# Patient Record
Sex: Female | Born: 1985 | Race: White | Hispanic: No | Marital: Married | Smoking: Never smoker
Health system: Southern US, Community
[De-identification: ages and names within clinical notes are randomized; demographics above are authoritative.]

## PROBLEM LIST (undated history)

## (undated) ENCOUNTER — Inpatient Hospital Stay (HOSPITAL_COMMUNITY): Payer: Self-pay

## (undated) DIAGNOSIS — F411 Generalized anxiety disorder: Secondary | ICD-10-CM

## (undated) DIAGNOSIS — E785 Hyperlipidemia, unspecified: Secondary | ICD-10-CM

## (undated) DIAGNOSIS — N809 Endometriosis, unspecified: Secondary | ICD-10-CM

## (undated) DIAGNOSIS — E039 Hypothyroidism, unspecified: Secondary | ICD-10-CM

## (undated) DIAGNOSIS — Z87442 Personal history of urinary calculi: Secondary | ICD-10-CM

## (undated) DIAGNOSIS — K5792 Diverticulitis of intestine, part unspecified, without perforation or abscess without bleeding: Secondary | ICD-10-CM

## (undated) DIAGNOSIS — O139 Gestational [pregnancy-induced] hypertension without significant proteinuria, unspecified trimester: Secondary | ICD-10-CM

## (undated) DIAGNOSIS — N946 Dysmenorrhea, unspecified: Secondary | ICD-10-CM

## (undated) DIAGNOSIS — F909 Attention-deficit hyperactivity disorder, unspecified type: Secondary | ICD-10-CM

## (undated) DIAGNOSIS — Z8249 Family history of ischemic heart disease and other diseases of the circulatory system: Secondary | ICD-10-CM

## (undated) DIAGNOSIS — N201 Calculus of ureter: Secondary | ICD-10-CM

## (undated) DIAGNOSIS — I456 Pre-excitation syndrome: Secondary | ICD-10-CM

## (undated) DIAGNOSIS — F419 Anxiety disorder, unspecified: Secondary | ICD-10-CM

## (undated) DIAGNOSIS — G8929 Other chronic pain: Secondary | ICD-10-CM

## (undated) DIAGNOSIS — Z8759 Personal history of other complications of pregnancy, childbirth and the puerperium: Secondary | ICD-10-CM

## (undated) DIAGNOSIS — Z973 Presence of spectacles and contact lenses: Secondary | ICD-10-CM

## (undated) DIAGNOSIS — Z8619 Personal history of other infectious and parasitic diseases: Secondary | ICD-10-CM

## (undated) DIAGNOSIS — R61 Generalized hyperhidrosis: Secondary | ICD-10-CM

## (undated) DIAGNOSIS — Z8679 Personal history of other diseases of the circulatory system: Secondary | ICD-10-CM

## (undated) DIAGNOSIS — D229 Melanocytic nevi, unspecified: Secondary | ICD-10-CM

## (undated) HISTORY — PX: MYRINGOTOMY: SUR874

## (undated) HISTORY — DX: Melanocytic nevi, unspecified: D22.9

## (undated) HISTORY — DX: Gestational (pregnancy-induced) hypertension without significant proteinuria, unspecified trimester: O13.9

## (undated) HISTORY — DX: Personal history of urinary calculi: Z87.442

## (undated) HISTORY — PX: MOLE REMOVAL: SHX2046

## (undated) HISTORY — DX: Personal history of other infectious and parasitic diseases: Z86.19

## (undated) HISTORY — PX: TONSILLECTOMY AND ADENOIDECTOMY: SUR1326

## (undated) HISTORY — DX: Diverticulitis of intestine, part unspecified, without perforation or abscess without bleeding: K57.92

## (undated) HISTORY — PX: TYMPANOSTOMY TUBE PLACEMENT: SHX32

## (undated) HISTORY — DX: Hypothyroidism, unspecified: E03.9

## (undated) HISTORY — PX: CYSTOSCOPY: SUR368

## (undated) HISTORY — DX: Hyperlipidemia, unspecified: E78.5

## (undated) HISTORY — PX: WISDOM TOOTH EXTRACTION: SHX21

## (undated) HISTORY — DX: Pre-excitation syndrome: I45.6

## (undated) HISTORY — PX: COSMETIC SURGERY: SHX468

## (undated) HISTORY — DX: Generalized hyperhidrosis: R61

## (undated) HISTORY — DX: Attention-deficit hyperactivity disorder, unspecified type: F90.9

## (undated) HISTORY — DX: Calculus of ureter: N20.1

## (undated) HISTORY — DX: Dysmenorrhea, unspecified: N94.6

---
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
## 2006-02-19 ENCOUNTER — Ambulatory Visit: Payer: Self-pay | Admitting: Family Medicine

## 2006-05-21 ENCOUNTER — Ambulatory Visit: Payer: Self-pay | Admitting: Family Medicine

## 2006-09-06 ENCOUNTER — Encounter: Payer: Self-pay | Admitting: Family Medicine

## 2006-09-06 LAB — CONVERTED CEMR LAB: Pap Smear: NORMAL

## 2006-09-12 ENCOUNTER — Ambulatory Visit: Payer: Self-pay | Admitting: Family Medicine

## 2006-09-12 ENCOUNTER — Other Ambulatory Visit: Admission: RE | Admit: 2006-09-12 | Discharge: 2006-09-12 | Payer: Self-pay | Admitting: Family Medicine

## 2006-10-02 ENCOUNTER — Ambulatory Visit: Payer: Self-pay | Admitting: Family Medicine

## 2006-12-02 ENCOUNTER — Ambulatory Visit: Payer: Self-pay | Admitting: Family Medicine

## 2006-12-25 ENCOUNTER — Encounter: Payer: Self-pay | Admitting: Family Medicine

## 2006-12-25 DIAGNOSIS — E039 Hypothyroidism, unspecified: Secondary | ICD-10-CM

## 2006-12-25 DIAGNOSIS — E785 Hyperlipidemia, unspecified: Secondary | ICD-10-CM | POA: Insufficient documentation

## 2007-04-07 ENCOUNTER — Ambulatory Visit: Payer: Self-pay | Admitting: Family Medicine

## 2007-08-18 ENCOUNTER — Telehealth: Payer: Self-pay | Admitting: Family Medicine

## 2007-10-19 ENCOUNTER — Other Ambulatory Visit: Admission: RE | Admit: 2007-10-19 | Discharge: 2007-10-19 | Payer: Self-pay | Admitting: Family Medicine

## 2007-10-19 ENCOUNTER — Ambulatory Visit: Payer: Self-pay | Admitting: Family Medicine

## 2007-10-19 ENCOUNTER — Encounter: Payer: Self-pay | Admitting: Family Medicine

## 2007-10-19 DIAGNOSIS — N946 Dysmenorrhea, unspecified: Secondary | ICD-10-CM | POA: Insufficient documentation

## 2007-10-23 ENCOUNTER — Encounter (INDEPENDENT_AMBULATORY_CARE_PROVIDER_SITE_OTHER): Payer: Self-pay | Admitting: *Deleted

## 2007-10-23 LAB — CONVERTED CEMR LAB
AST: 18 units/L (ref 0–37)
Albumin: 4 g/dL (ref 3.5–5.2)
Alkaline Phosphatase: 43 units/L (ref 39–117)
BUN: 10 mg/dL (ref 6–23)
Basophils Absolute: 0 10*3/uL (ref 0.0–0.1)
Chloride: 103 meq/L (ref 96–112)
Cholesterol: 184 mg/dL (ref 0–200)
Eosinophils Absolute: 0.1 10*3/uL (ref 0.0–0.6)
GFR calc non Af Amer: 85 mL/min
HCT: 41.8 % (ref 36.0–46.0)
HDL: 63 mg/dL (ref >39.0)
LDL Cholesterol: 96 mg/dL (ref 0–99)
MCHC: 34.9 g/dL (ref 30.0–36.0)
MCV: 90.5 fL (ref 78.0–100.0)
Monocytes Relative: 6.3 % (ref 3.0–11.0)
Neutrophils Relative %: 64.1 % (ref 43.0–77.0)
Pap Smear: NORMAL
Platelets: 211 10*3/uL (ref 150–400)
RBC: 4.62 M/uL (ref 3.87–5.11)
Sodium: 139 meq/L (ref 135–145)
TSH: 1.04 microintl units/mL (ref 0.35–5.50)
Total CHOL/HDL Ratio: 2.9
Triglycerides: 125 mg/dL (ref 0–149)

## 2007-12-06 DIAGNOSIS — N201 Calculus of ureter: Secondary | ICD-10-CM

## 2007-12-06 HISTORY — DX: Calculus of ureter: N20.1

## 2007-12-10 ENCOUNTER — Telehealth: Payer: Self-pay | Admitting: Family Medicine

## 2007-12-10 ENCOUNTER — Inpatient Hospital Stay (HOSPITAL_COMMUNITY): Admission: AD | Admit: 2007-12-10 | Discharge: 2007-12-10 | Payer: Self-pay | Admitting: Obstetrics & Gynecology

## 2007-12-11 ENCOUNTER — Emergency Department (HOSPITAL_COMMUNITY): Admission: EM | Admit: 2007-12-11 | Discharge: 2007-12-11 | Payer: Self-pay | Admitting: Emergency Medicine

## 2007-12-16 ENCOUNTER — Encounter: Payer: Self-pay | Admitting: Family Medicine

## 2008-04-04 ENCOUNTER — Telehealth: Payer: Self-pay | Admitting: Family Medicine

## 2008-05-03 ENCOUNTER — Encounter: Payer: Self-pay | Admitting: Family Medicine

## 2008-05-09 ENCOUNTER — Telehealth (INDEPENDENT_AMBULATORY_CARE_PROVIDER_SITE_OTHER): Payer: Self-pay | Admitting: *Deleted

## 2008-05-11 ENCOUNTER — Ambulatory Visit: Payer: Self-pay | Admitting: Family Medicine

## 2008-05-11 LAB — CONVERTED CEMR LAB
Bilirubin Urine: NEGATIVE
HCT: 44.1 % (ref 36.0–46.0)
Hemoglobin: 15.3 g/dL — ABNORMAL HIGH (ref 12.0–15.0)
Ketones, urine, test strip: NEGATIVE
RBC / HPF: 0
Specific Gravity, Urine: >=1.03
Urobilinogen, UA: 0.2
WBC, UA: 1 cells/hpf

## 2008-05-13 ENCOUNTER — Encounter (INDEPENDENT_AMBULATORY_CARE_PROVIDER_SITE_OTHER): Payer: Self-pay | Admitting: *Deleted

## 2008-07-07 ENCOUNTER — Telehealth: Payer: Self-pay | Admitting: Family Medicine

## 2008-08-11 ENCOUNTER — Telehealth: Payer: Self-pay | Admitting: Family Medicine

## 2008-08-12 ENCOUNTER — Ambulatory Visit: Payer: Self-pay | Admitting: Family Medicine

## 2008-08-16 ENCOUNTER — Telehealth: Payer: Self-pay | Admitting: Family Medicine

## 2008-12-16 ENCOUNTER — Ambulatory Visit: Payer: Self-pay | Admitting: Family Medicine

## 2008-12-16 DIAGNOSIS — Z87442 Personal history of urinary calculi: Secondary | ICD-10-CM | POA: Insufficient documentation

## 2008-12-16 HISTORY — DX: Personal history of urinary calculi: Z87.442

## 2008-12-16 LAB — CONVERTED CEMR LAB
Beta hcg, urine, semiquantitative: NEGATIVE
Bilirubin Urine: NEGATIVE
Casts: 0 /lpf
Glucose, Urine, Semiquant: NEGATIVE
Specific Gravity, Urine: 1.015
Yeast, UA: 0
pH: 6.5

## 2008-12-19 LAB — CONVERTED CEMR LAB
CO2: 28 meq/L (ref 19–32)
Calcium: 9.7 mg/dL (ref 8.4–10.5)
FSH: 10.2 milliintl units/mL
Free T4: 1.1 ng/dL (ref 0.6–1.6)
Glucose, Bld: 84 mg/dL (ref 70–99)
Hemoglobin: 15.6 g/dL — ABNORMAL HIGH (ref 12.0–15.0)
LH: 88.1 milliintl units/mL
Lymphocytes Relative: 32.4 % (ref 12.0–46.0)
Monocytes Relative: 9 % (ref 3.0–12.0)
Neutro Abs: 3 10*3/uL (ref 1.4–7.7)
RBC: 4.77 M/uL (ref 3.87–5.11)
RDW: 11.8 % (ref 11.5–14.6)
Sodium: 141 meq/L (ref 135–145)
TSH: 0.79 microintl units/mL (ref 0.35–5.50)

## 2009-12-11 ENCOUNTER — Ambulatory Visit: Payer: Self-pay | Admitting: Family Medicine

## 2009-12-11 LAB — CONVERTED CEMR LAB: Rapid Strep: NEGATIVE

## 2009-12-12 ENCOUNTER — Emergency Department (HOSPITAL_COMMUNITY)
Admission: EM | Admit: 2009-12-12 | Discharge: 2009-12-12 | Payer: Self-pay | Source: Home / Self Care | Admitting: Emergency Medicine

## 2009-12-29 ENCOUNTER — Telehealth: Payer: Self-pay | Admitting: Family Medicine

## 2009-12-29 ENCOUNTER — Ambulatory Visit: Payer: Self-pay | Admitting: Family Medicine

## 2009-12-29 LAB — CONVERTED CEMR LAB
Casts: 0 /lpf
Ketones, urine, test strip: NEGATIVE
Nitrite: NEGATIVE
Specific Gravity, Urine: 1.01
Urine crystals, microscopic: 0 /hpf
WBC Urine, dipstick: NEGATIVE
Yeast, UA: 0

## 2009-12-30 ENCOUNTER — Encounter: Payer: Self-pay | Admitting: Family Medicine

## 2010-01-01 ENCOUNTER — Telehealth: Payer: Self-pay | Admitting: Family Medicine

## 2010-01-01 ENCOUNTER — Encounter: Payer: Self-pay | Admitting: Family Medicine

## 2010-01-25 ENCOUNTER — Encounter: Payer: Self-pay | Admitting: Family Medicine

## 2010-01-25 ENCOUNTER — Ambulatory Visit: Payer: Self-pay | Admitting: Family Medicine

## 2010-01-25 LAB — CONVERTED CEMR LAB
Bilirubin Urine: NEGATIVE
Glucose, Urine, Semiquant: NEGATIVE
WBC Urine, dipstick: NEGATIVE
pH: 5

## 2010-03-13 ENCOUNTER — Telehealth (INDEPENDENT_AMBULATORY_CARE_PROVIDER_SITE_OTHER): Payer: Self-pay | Admitting: *Deleted

## 2010-03-14 ENCOUNTER — Ambulatory Visit: Payer: Self-pay | Admitting: Family Medicine

## 2010-03-14 LAB — CONVERTED CEMR LAB
ALT: 30 units/L (ref 0–35)
AST: 33 units/L (ref 0–37)
Albumin: 4.4 g/dL (ref 3.5–5.2)
BUN: 9 mg/dL (ref 6–23)
Chloride: 107 meq/L (ref 96–112)
Cholesterol: 177 mg/dL (ref 0–200)
Eosinophils Relative: 1.3 % (ref 0.0–5.0)
Free T4: 1.2 ng/dL (ref 0.6–1.6)
Glucose, Bld: 82 mg/dL (ref 70–99)
HCT: 43.1 % (ref 36.0–46.0)
Hemoglobin: 14.8 g/dL (ref 12.0–15.0)
LDL Cholesterol: 87 mg/dL (ref 0–99)
Lymphs Abs: 2.5 10*3/uL (ref 0.7–4.0)
MCV: 95.1 fL (ref 78.0–100.0)
Monocytes Absolute: 0.6 10*3/uL (ref 0.1–1.0)
Monocytes Relative: 8.7 % (ref 3.0–12.0)
Neutro Abs: 3.2 10*3/uL (ref 1.4–7.7)
Platelets: 193 10*3/uL (ref 150.0–400.0)
Potassium: 4.2 meq/L (ref 3.5–5.1)
Sodium: 141 meq/L (ref 135–145)
TSH: 1.32 microintl units/mL (ref 0.35–5.50)
Total Bilirubin: 0.8 mg/dL (ref 0.3–1.2)
Total Protein: 6.9 g/dL (ref 6.0–8.3)
WBC: 6.5 10*3/uL (ref 4.5–10.5)

## 2010-03-21 ENCOUNTER — Other Ambulatory Visit: Admission: RE | Admit: 2010-03-21 | Discharge: 2010-03-21 | Payer: Self-pay | Admitting: Family Medicine

## 2010-03-21 ENCOUNTER — Ambulatory Visit: Payer: Self-pay | Admitting: Family Medicine

## 2010-03-21 LAB — CONVERTED CEMR LAB
Ketones, urine, test strip: NEGATIVE
Nitrite: NEGATIVE
Specific Gravity, Urine: 1.015
Urobilinogen, UA: 0.2
WBC Urine, dipstick: NEGATIVE

## 2010-03-21 LAB — HM PAP SMEAR

## 2010-03-23 ENCOUNTER — Encounter (INDEPENDENT_AMBULATORY_CARE_PROVIDER_SITE_OTHER): Payer: Self-pay | Admitting: *Deleted

## 2010-03-23 LAB — CONVERTED CEMR LAB: Pap Smear: NEGATIVE

## 2010-03-28 ENCOUNTER — Encounter: Payer: Self-pay | Admitting: Family Medicine

## 2010-06-28 ENCOUNTER — Ambulatory Visit: Payer: Self-pay | Admitting: Internal Medicine

## 2010-06-28 DIAGNOSIS — R109 Unspecified abdominal pain: Secondary | ICD-10-CM | POA: Insufficient documentation

## 2010-06-28 LAB — CONVERTED CEMR LAB
Beta hcg, urine, semiquantitative: NEGATIVE
Bilirubin Urine: NEGATIVE
Ketones, urine, test strip: NEGATIVE
Mucus, UA: 0
Nitrite: NEGATIVE
Urobilinogen, UA: 0.2

## 2010-06-29 ENCOUNTER — Encounter: Payer: Self-pay | Admitting: Family Medicine

## 2010-07-05 ENCOUNTER — Encounter: Payer: Self-pay | Admitting: Family Medicine

## 2010-07-10 ENCOUNTER — Ambulatory Visit
Admission: RE | Admit: 2010-07-10 | Discharge: 2010-07-10 | Payer: Self-pay | Source: Home / Self Care | Admitting: Urology

## 2010-07-10 HISTORY — PX: CYSTOSCOPY/RETROGRADE/URETEROSCOPY: SHX5316

## 2010-08-08 ENCOUNTER — Telehealth: Payer: Self-pay | Admitting: Family Medicine

## 2010-11-06 NOTE — Consult Note (Signed)
Summary: Dr.Houston Kimbrough,[facility],Note  Dr.Houston Kimbrough,[facility],Note   Imported By: Beau Fanny 07/06/2010 09:19:24  _____________________________________________________________________  External Attachment:    Type:   Image     Comment:   External Document

## 2010-11-06 NOTE — Progress Notes (Signed)
Summary: ? Kidney stone  Phone Note Call from Patient   Caller: Patient Call For: Judith Part MD Summary of Call: Patient states that she may have passed a kidney stone or has a kidney stone.  Pain upon urination, blood in urine, pressure in lower abdominal area.  No back pain.  Symptoms started yesterday.  Please advise. Initial call taken by: Linde Gillis CMA Duncan Dull),  December 29, 2009 2:55 PM  Follow-up for Phone Call        squeeze her in sometime this afternoon please for a quick visit - thanks  Follow-up by: Judith Part MD,  December 29, 2009 3:00 PM  Additional Follow-up for Phone Call Additional follow up Details #1::        Put here on the schedule for 3:30 this afternoon. Additional Follow-up by: Linde Gillis CMA Duncan Dull),  December 29, 2009 3:14 PM

## 2010-11-06 NOTE — Assessment & Plan Note (Signed)
Summary: YEARLY PHYSICAL/JRR   Vital Signs:  Patient profile:   25 year old female Menstrual status:  regular Height:      64.5 inches Weight:      129.50 pounds BMI:     21.96 Temp:     97.6 degrees F oral Pulse rate:   72 / minute Pulse rhythm:   regular BP sitting:   106 / 64  (left arm) Cuff size:   regular  Vitals Entered By: Lewanda Rife LPN (March 21, 2010 10:37 AM) CC: cpx and ? kidney stone    History of Present Illness: here for wellness exam   wt is down 7 lb with bmi 21  bp great 106/64  hypothyroidism  tsh and free T4 are stable   menses - have been tolerable  pap 1/09- due for one  will go on honeymoon in october  is sexually active and using condoms  is ok with doing gon and chlam screen with  pap  with her fiancee 7 years     Td 07  has been having trouble with R sided kidney stones - [facility] still blood in urine today  thinks stone in ureter -- still there improved and then got worse again -- pain started up last night  needs to f/u with urology-- may need surgical intervention  is drinking lots of fluids  wants to have x ray for that - KUB -- wants to [facility]   no chance pregnant    lipids are great with trig 102 HDL 69 and LDL 87- excellent    Allergies: 1)  ! 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 Eyes:  Denies blurring and eye irritation. CV:  Denies chest pain or discomfort, lightheadness, palpitations, and shortness of breath with exertion. Resp:  Denies cough, shortness of breath, and wheezing. GI:  Complains of abdominal pain and nausea; denies bloody stools, change in bowel habits, indigestion, and vomiting. GU:  Complains of hematuria; denies dysuria and urinary frequency. MS:  Complains of low back pain; denies cramps, muscle weakness, and stiffness. Derm:  Denies itching, lesion(s), poor wound healing, and rash. Neuro:  Denies numbness and tingling. Psych:  Denies anxiety and depression. Endo:  Denies cold intolerance, excessive thirst, excessive urination, and heat intolerance. Heme:  Denies abnormal bruising and enlarge lymph nodes.  Physical Exam  General:  Well-developed,well-nourished,in no acute distress; alert,appropriate and cooperative throughout examination Head:  normocephalic, atraumatic, and no abnormalities observed.   Eyes:  vision grossly intact, pupils equal, pupils round, and pupils reactive to light.  no conjunctival pallor, injection or icterus  Ears:  R ear normal and L ear normal.   Nose:  no nasal discharge.   Mouth:  pharynx pink and moist.   Neck:  supple with full rom and no masses or thyromegally, no JVD or carotid bruit  Chest Wall:  No deformities, masses,  or tenderness noted. Breasts:  No mass, nodules, thickening, tenderness, bulging, retraction, inflamation, nipple discharge or skin changes noted.   Lungs:  Normal respiratory effort, chest expands symmetrically. Lungs are clear to auscultation, no crackles or wheezes. Heart:  Normal rate and regular rhythm. S1 and S2 normal without gallop, murmur, click, rub or other extra sounds. Abdomen:  tender R llower abd and flank without rebound or gaurding  soft, normal bowel sounds, no  distention, no masses, no hepatomegaly, and no splenomegaly.   Genitalia:  Normal introitus for age, no external lesions, no vaginal discharge, mucosa pink and moist, no vaginal or cervical lesions, no vaginal atrophy, no friaility or hemorrhage, normal uterus size and position, no adnexal masses or tenderness Msk:  some mild R cva tenderness no acute joint changes  Pulses:  R and L carotid,radial,femoral,dorsalis pedis and posterior tibial pulses are full and equal bilaterally Extremities:  No clubbing, cyanosis, edema, or deformity noted with normal full range of motion of all joints.   Neurologic:  sensation intact to light touch, gait normal, and DTRs symmetrical and normal.   Skin:  Intact without suspicious lesions or rashes Cervical Nodes:  No lymphadenopathy noted Axillary Nodes:  No palpable lymphadenopathy Inguinal Nodes:  No significant adenopathy Psych:  normal affect, talkative and pleasant    Impression & Recommendations:  Problem # 1:  HEALTH MAINTENANCE EXAM (ICD-V70.0) Assessment Comment Only reviewed health habits including diet, exercise and skin cancer prevention reviewed health maintenance list and family history  adv to stop tanner - and get derm check once yearly for skin cancer disc imp of sun protection for skin cancer prevention   Problem # 2:  ROUTINE GYNECOLOGICAL EXAMINATION (ICD-V72.31) Assessment: Comment Only annual exam std test at pt request  start depo provera for contraception and period control as well  Problem # 3:  RENAL CALCULUS, HX OF (ICD-V13.01) Assessment: Deteriorated with continued hematuria and recurrence of flank pain urol consult asap  KUB today to see if prev ureteral R stone is stil the problem Orders: Radiology Referral (Radiology) Urology Referral (Urology) UA Dipstick w/o Micro (manual) (16109)  Problem # 4:  HYPOTHYROIDISM (ICD-244.9) Assessment: Unchanged  good labs and stable clinically  Her updated medication list  for this problem includes:    Synthroid 50 Mcg Tabs (Levothyroxine sodium) .Marland Kitchen... 1 by mouth once daily  Labs Reviewed: TSH: 1.32 (03/14/2010)    Chol: 177 (03/14/2010)   HDL: 69.20 (03/14/2010)   LDL: 87 (03/14/2010)   TG: 102.0 (03/14/2010)  Orders: Prescription Created Electronically [phone])  Problem # 5:  HYPERLIPIDEMIA (ICD-272.4) Assessment: Improved  great chol with good diet commended on this  rev labs with pt   Labs Reviewed: SGOT: 33 (03/14/2010)   SGPT: 30 (03/14/2010)   HDL:69.20 (03/14/2010), 63.0 (10/19/2007)  LDL:87 (03/14/2010), 96 (10/19/2007)  Chol:177 (03/14/2010), 184 (10/19/2007)  Trig:102.0 (03/14/2010), 125 (10/19/2007)  Problem # 6:  SEXUALLY TRANSMITTED DISEASE, EXPOSURE TO (ICD-V01.6) Assessment: Comment Only will add std screen to pap at pt's request uses condoms / one partner- not high risk   Problem # 7:  CONTRACEPTIVE MANAGEMENT (ICD-V25.09) Assessment: New  starting depo provera  today- with wedding upcoming  disc poss side eff incl wt gain/ irregular bleeding - and usually amenorrhea after 2-3 mo (depending on pt)  will return in 3 mo for next injection  Orders: Depo-Provera 150mg  (J1055) Admin of Therapeutic Inj  intramuscular or subcutaneous (09811)  Complete Medication List: 1)  Synthroid 50 Mcg Tabs (Levothyroxine sodium) .Marland Kitchen.. 1 by  mouth once daily 2)  Roxicet 5-325 Mg Tabs (Oxycodone-acetaminophen) .... Take one to two  tablets by mouth every four to six hours as needed for pain 3)  Hydrocodone-acetaminophen 5-500 Mg Tabs (Hydrocodone-acetaminophen) .... Take one tablet by mouth every six hours as needed for pain  Patient Instructions: 1)  first depo provera shot today  2)  we will update you with pap and other test results  3)  we will do urology referral at check  out  4)  we will do radiology referral at check out  Prescriptions: SYNTHROID 50 MCG  TABS (LEVOTHYROXINE SODIUM) 1 by mouth once daily  #30 x 11   Entered and  Authorized by:   Judith Part MD   Signed by:   Judith Part MD on 03/21/2010   Method used:   [facility]        [facility] [id number]       [address]       [place], [place]  [address]       Ph: [phone] or [phone]       Fax: [phone]   RxID:   [id number]   Current Allergies (reviewed today): ! * Wilfred Curtis  Laboratory Results   Urine Tests  Date/Time Received: March 21, 2010 10:39 AM  Date/Time Reported: March 21, 2010 10:39 AM   Routine Urinalysis   Color: yellow Appearance: Cloudy Glucose: negative   (Normal Range: Negative) Bilirubin: negative   (Normal Range: Negative) Ketone: negative   (Normal Range: Negative) Spec. Gravity: 1.015   (Normal Range: 1.003-1.035) Blood: large   (Normal Range: Negative) pH: 6.0   (Normal Range: 5.0-8.0) Protein: trace   (Normal Range: Negative) Urobilinogen: 0.2   (Normal Range: 0-1) Nitrite: negative   (Normal Range: Negative) Leukocyte Esterace: negative   (Normal Range: Negative)    Urine HCG: negative       Medication Administration  Injection # 1:    Medication: Depo-Provera 150mg     Diagnosis: CONTRACEPTIVE MANAGEMENT (ICD-V25.09)    Route: IM    Site: LUOQ gluteus    Exp Date: 08/07/2012    Lot #: [id number]    Mfr: Francisca December    Patient tolerated injection without complications    Given by: Lewanda Rife LPN (March 21, 2010 11:54 AM)  Orders Added: 1)  Radiology Referral [Radiology] 2)  Urology Referral [Urology] 3)  Depo-Provera 150mg  [J1055] 4)  Admin of Therapeutic Inj  intramuscular or subcutaneous [96372] 5)  UA Dipstick w/o Micro (manual) [81002] 6)  Est. Patient 18-39 years [99395] 7)  Est. Patient Level II [34742] 8)  Prescription Created Electronically [phone]

## 2010-11-06 NOTE — Progress Notes (Signed)
Summary: kidney stone  Phone Note Call from Patient Call back at Home Phone [phone] Call back at [phone]   Caller: Patient Call For: Judith Part MD Summary of Call: Victoria Russell says she was seen at her urologist. She is passing kidney stone now. She wants to know if she needs to continue taking antibiotics. Please advise. Initial call taken by: Melody Comas,  January 01, 2010 10:47 AM  Follow-up for Phone Call        thanks for the update I'm glad she passed it  can stop abx unless her urologist wants her to continue  Follow-up by: Judith Part MD,  January 01, 2010 11:13 AM  Additional Follow-up for Phone Call Additional follow up Details #1::        Left message that patient could stop the antibiotics if okay with urologist.  Additional Follow-up by: Melody Comas,  January 01, 2010 11:36 AM

## 2010-11-06 NOTE — Assessment & Plan Note (Signed)
Summary: URINE SAMPLE FOR POSSIBLE KIDNEY STONE   Nurse Visit   Allergies: 1)  ! * Synthroid Generic Laboratory Results   Urine Tests  Date/Time Received: January 25, 2010 8:02 AM  Date/Time Reported: January 25, 2010 8:02 AM   Routine Urinalysis   Color: orange Appearance: Clear Glucose: negative   (Normal Range: Negative) Bilirubin: negative   (Normal Range: Negative) Ketone: negative   (Normal Range: Negative) Spec. Gravity: 1.015   (Normal Range: 1.003-1.035) Blood: large   (Normal Range: Negative) pH: 5.0   (Normal Range: 5.0-8.0) Protein: negative   (Normal Range: Negative) Urobilinogen: 0.2   (Normal Range: 0-1) Nitrite: negative   (Normal Range: Negative) Leukocyte Esterace: negative   (Normal Range: Negative)       Orders Added: 1)  Urology Referral [Urology] Prescriptions: HYDROCODONE-ACETAMINOPHEN 5-500 MG TABS (HYDROCODONE-ACETAMINOPHEN) take one tablet by mouth every six hours as needed for pain  #60 x 0   Entered and Authorized by:   Ruthe Mannan MD   Signed by:   Ruthe Mannan MD on 01/25/2010   Method used:   Print then Give to Patient   RxID:   [id number]

## 2010-11-06 NOTE — Miscellaneous (Signed)
Summary: Controlled Substance Agreement  Controlled Substance Agreement   Imported By: Lanelle Bal 03/26/2010 07:45:53  _____________________________________________________________________  External Attachment:    Type:   Image     Comment:   External Document

## 2010-11-06 NOTE — Progress Notes (Signed)
----   Converted from flag ---- ---- 03/13/2010 8:33 AM, Colon Flattery Tower MD wrote: please check wellness and lipids as well as free T4 244.9, v70.0- thanks   ---- 03/13/2010 8:12 AM, Liane Comber CMA (AAMA) wrote: Pt is scheduled for cpx labs tomorrow, what labs to draw and dx codes? Thanks Tasha ------------------------------

## 2010-11-06 NOTE — Assessment & Plan Note (Signed)
Summary: POSSIBLE UTI OR KIDNEY STONE????   Vital Signs:  Patient profile:   25 year old female Menstrual status:  regular Temp:     98.1 degrees F oral Pulse rate:   60 / minute Pulse rhythm:   regular BP sitting:   110 / 78  (left arm) Cuff size:   regular  Vitals Entered By: Selena Batten Dance CMA Duncan Dull) (June 28, 2010 8:59 AM) CC: ? UTI/Kidney stone   History of Present Illness: CC: ? kidney stone  25 yo with known nephrolithiasis and known stones in right kidney measuring 2mm followed by urology ([facility]) presents with 1d h/o abd pain, sharp intermittent.  No radiation.  No nausea/vomiting.  Appetite down (no breakfast this am).  No fevers/chills.  No bowel changes (no d/c.)  Still with appendix.  No smoking.  Using condom, no longer on depo.  Allergies: 1)  ! 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 Lungs are clear to auscultation, no crackles or wheezes. Heart:  Normal rate and regular rhythm. S1 and S2 normal without gallop, murmur, click, rub or other extra sounds. Abdomen:  max tenderness suprapubically but tender throughout abd with palpation, NABS, no masses or HSM, no rebound or peritonitis sxs.  no CVA tenderness Pulses:  2+ rad pulses Extremities:  No clubbing, cyanosis, edema, or deformity noted with normal full range of motion of all joints.   Skin:  Intact without suspicious lesions or rashes   Impression & Recommendations:  Problem # 1:  ABDOMINAL PAIN,  SUPRAPUBIC (ICD-789.09)  Sounds like return of kidney stones.  Shot of toradol today, Start flomax as well as naprosyn, roxicet for breakthrough pain.  close f/u with urology.  doubt appendicitis (no rebound, h/o kidney stones, large blood on UA).  Her updated medication list for this problem includes:    Roxicet 5-325 Mg Tabs (Oxycodone-acetaminophen) .Marland Kitchen... Take one to two  tablets by mouth every four to six hours as needed for pain    Hydrocodone-acetaminophen 5-500 Mg Tabs (Hydrocodone-acetaminophen) .Marland Kitchen... Take one tablet by mouth every six hours as needed for pain    Naproxen 500 Mg Tabs (Naproxen) .Marland Kitchen... Take one by mouth two times a day with food.  Orders: Admin of Therapeutic Inj  intramuscular or subcutaneous (16109) Ketorolac-Toradol 15mg  (U0454) Urology Referral (Urology)  Complete Medication List: 1)  Synthroid 50 Mcg Tabs (Levothyroxine sodium) .Marland Kitchen.. 1 by mouth once daily 2)  Roxicet 5-325 Mg Tabs (Oxycodone-acetaminophen) .... Take one to two  tablets by mouth every four to six hours as needed for pain 3)  Hydrocodone-acetaminophen 5-500 Mg Tabs (Hydrocodone-acetaminophen) .... Take one tablet by mouth every six hours as needed for pain 4)  Flomax 0.4 Mg Caps (Tamsulosin hcl) .... One daily x 10 days 5)  Naproxen 500 Mg Tabs (Naproxen) .... Take one by mouth two times a day with food.  Patient Instructions: 1)  Pass by [facility]'s office for appointment with urology. 2)  I think you have a return of your kidney stone.  Start flomax daily as well as naprosyn twice daily with food.  Oxycodone  for breakthrough pain only. 3)  Good to see you today, I hope you feel better. Prescriptions: ROXICET 5-325 MG TABS (OXYCODONE-ACETAMINOPHEN) take one to two  tablets by mouth every four to six hours as needed for pain  #20 x 0   Entered and Authorized by:   Eustaquio Boyden  MD   Signed by:   Eustaquio Boyden  MD on 06/28/2010   Method used:   Print then Give to Patient   RxID:    [id number] NAPROXEN 500 MG TABS (NAPROXEN) take one by mouth two times a day with food.  #30 x 0   Entered and Authorized by:   Eustaquio Boyden  MD   Signed by:   Eustaquio Boyden  MD on 06/28/2010   Method used:   [facility]        [facility] [facility]       [address]       [place], [place]  [address]       Ph: [phone] or [phone]       Fax: [phone]   RxID:   [id number] FLOMAX 0.4 MG CAPS (TAMSULOSIN HCL) one daily x 10 days  #10 x 0   Entered and Authorized by:   Eustaquio Boyden  MD   Signed by:   Eustaquio Boyden  MD on 06/28/2010   Method used:   [facility]        [facility] [id number]       [address]       [place], [place]  [address]       Ph: [phone] or [phone]       Fax: [phone]   RxID:   [id number]   Laboratory Results   Urine Tests  Date/Time Received: June 28, 2010 8:54 AM  Date/Time Reported: June 28, 2010 8:54 AM   Routine Urinalysis   Color: dark yellow Appearance: Cloudy Glucose: negative   (Normal Range: Negative) Bilirubin: negative   (Normal Range: Negative) Ketone: negative   (Normal Range: Negative) Spec. Gravity: 1.020   (Normal Range: 1.003-1.035) Blood: large   (Normal Range: Negative) pH: 6.0   (Normal Range: 5.0-8.0) Protein: trace   (Normal Range: Negative) Urobilinogen: 0.2   (Normal Range: 0-1) Nitrite: negative   (Normal Range: Negative) Leukocyte Esterace: trace   (Normal Range: Negative)  Urine Microscopic WBC/HPF: rare RBC/HPF: TNTC Bacteria/HPF: tr Mucous/HPF: 0 Epithelial/HPF: rare Crystals/HPF: yes Casts/LPF: 0 Yeast/HPF: 0    Urine HCG: negative Comments: read by .............Eustaquio Boyden  MD  June 28, 2010 9:17 AM      Current Allergies (reviewed today): ! * SYNTHROID GENERIC    Medication Administration  Injection # 1:    Medication: Ketorolac-Toradol 15mg     Diagnosis: ABDOMINAL PAIN,  SUPRAPUBIC (ICD-789.09)    Route: IM    Site: R deltoid    Exp Date: 12/06/2011    Lot #: [id number]    Mfr: Hospira    Comments: 30mg  per Dr. Sharen Hones    Patient tolerated injection without complications    Given by: Selena Batten Dance CMA Duncan Dull) (June 28, 2010 9:29 AM)  Orders Added: 1)  Est. Patient Level III [73419] 2)  Admin of Therapeutic Inj  intramuscular or subcutaneous [96372] 3)  Ketorolac-Toradol 15mg  [J1885] 4)  Urology Referral [Urology]

## 2010-11-06 NOTE — Miscellaneous (Signed)
Summary: pap results   Clinical Lists Changes  Observations: Added new observation of PAP SMEAR: normal (03/23/2010 15:18)      Preventive Care Screening  Pap Smear:    Date:  03/23/2010    Results:  normal

## 2010-11-06 NOTE — Assessment & Plan Note (Signed)
Summary: ? Kidney stone/nt   Vital Signs:  Patient profile:   25 year old female Menstrual status:  regular Height:      64.5 inches Weight:      136.13 pounds BMI:     23.09 Temp:     97.6 degrees F oral Pulse rate:   64 / minute Pulse rhythm:   regular BP sitting:   112 / 70  (left arm) Cuff size:   regular  Vitals Entered By: Linde Gillis CMA Duncan Dull) (December 29, 2009 3:25 PM) CC: possible kidney stone   History of Present Illness: this is not as bad as the last one   yesterday mid day - the pain started  some burning to urinate and pressure over bladder no back or flank pain   also saw some blood in urine no n/v no fever  feels not too tired   has to urinate a lot -- gets there and not much volume     Allergies: 1)  ! 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 Eyes:  Denies blurring and eye pain. CV:  Denies chest pain or discomfort and palpitations. Resp:  Denies cough and wheezing. GI:  Complains of abdominal pain; denies bloody stools, change in bowel habits, and indigestion. GU:  Complains of dysuria, hematuria, nocturia, and urinary frequency. MS:  Denies low back pain. Derm:  Denies itching, lesion(s), poor wound healing, and rash. Heme:  Denies abnormal  bruising and bleeding.  Physical Exam  General:  Well-developed,well-nourished,in no acute distress; alert,appropriate and cooperative throughout examination Head:  normocephalic, atraumatic, and no abnormalities observed.   Mouth:  pharynx pink and moist.   Neck:  No deformities, masses, or tenderness noted. Lungs:  Normal respiratory effort, chest expands symmetrically. Lungs are clear to auscultation, no crackles or wheezes. Heart:  Normal rate and regular rhythm. S1 and S2 normal without gallop, murmur, click, rub or other extra sounds. Abdomen:  suprapubic tenderness- without rebound or gaurding  no flank or back pain Msk:  No deformity or scoliosis noted of thoracic or lumbar spine.  no CVA tenderness  Skin:  Intact without suspicious lesions or rashes Cervical Nodes:  No lymphadenopathy noted Inguinal Nodes:  No significant adenopathy Psych:  normal affect, talkative and pleasant    Impression & Recommendations:  Problem # 1:  UTI (ICD-599.0) Assessment New with voiding symptoms and pos ua  no back or flank pain to indicate stone- but does have hematuria and hx of stone  will tx with cipro and update urine sent for culture - to update adv to call if worse or back or flank pain Her updated medication list for this problem includes:    Cipro 250 Mg Tabs (Ciprofloxacin hcl) .Marland Kitchen... 1 by mouth two times a day for 5 days  Orders: T-Culture, Urine ([id number]) Specimen Handling (09811) Prescription Created Electronically [id number])  Her updated medication list for this problem includes:  Cipro 250 Mg Tabs (Ciprofloxacin hcl) .Marland Kitchen... 1 by mouth two times a day for 5 days  Complete Medication List: 1)  Synthroid 50 Mcg Tabs (Levothyroxine sodium) .Marland Kitchen.. 1 by mouth once daily 2)  Roxicet 5-325 Mg Tabs (Oxycodone-acetaminophen) .... Take one to two  tablets by mouth every four to six hours as needed for pain 3)  Hydrocodone-acetaminophen 5-500 Mg Tabs (Hydrocodone-acetaminophen) ....  Take one tablet by mouth every six hours as needed for pain 4)  Cipro 250 Mg Tabs (Ciprofloxacin hcl) .Marland Kitchen.. 1 by mouth two times a day for 5 days  Patient Instructions: 1)  continue drinking lots of water 2)  call or seek care is symptoms don't improve in 2-3 days or if you develop back pain, nausea, or vomiting 3)  please update me or call nurse line if back pain or you think you have a stone  4)  I will update you when culture returns  Prescriptions: CIPRO 250 MG TABS (CIPROFLOXACIN HCL) 1 by mouth two times a day for 5 days  #10 x 0   Entered and Authorized by:   Judith Part MD   Signed by:   Judith Part MD on 12/29/2009   Method used:   [facility] to        [id number]       [address]       [place], [place]  [address]       Ph: [phone] or [phone]       Fax: [phone]   RxID:   [id number]   Current Allergies (reviewed today): ! * [facility]  Laboratory Results   Urine Tests   Date/Time Reported: December 29, 2009 3:52 PM   Routine Urinalysis   Color: yellow Appearance: Clear Glucose: negative   (Normal Range: Negative) Bilirubin: negative   (Normal Range: Negative) Ketone: negative   (Normal Range: Negative) Spec. Gravity: 1.010   (Normal Range: 1.003-1.035) Blood: trace-intact   (Normal Range: Negative) pH: 7.0   (Normal Range: 5.0-8.0) Protein: trace   (Normal Range: Negative) Urobilinogen: 0.2   (Normal Range: 0-1) Nitrite: negative   (Normal Range: Negative) Leukocyte Esterace: negative   (Normal Range: Negative)  Urine Microscopic WBC/HPF: 5-10 RBC/HPF: 5-10 Bacteria/HPF: many  Mucous/HPF: few  Epithelial/HPF: 1-2  Crystals/HPF: 0 Casts/LPF: 0 Yeast/HPF: 0 Other: 0

## 2010-11-06 NOTE — Assessment & Plan Note (Signed)
Summary: SORE THROAT, EAR STOPPED UP   Vital Signs:  Patient profile:   25 year old female Menstrual status:  regular Height:      64.5 inches Weight:      133.50 pounds BMI:     22.64 Temp:     98.2 degrees F oral Pulse rate:   60 / minute Pulse rhythm:   regular BP sitting:   100 / 60  (left arm) Cuff size:   regular  Vitals Entered By: Lewanda Rife LPN (December 11, 1608 12:35 PM)  History of Present Illness: a sore throat and funny feeling in L ear -pain and pressure and ringing  no fever now or before  st is not severe  some nasal drainage and mostly congestion -- green d/c and also coughs up some green d/c  symptoms just started yesterday   no n/v/d mild cough non smoker    strep test is neg   Allergies: 1)  ! 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 Eyes:  Denies blurring, discharge, and eye irritation. ENT:  Complains of earache, hoarseness, nasal congestion, postnasal drainage, sinus pressure, and sore throat; denies ear discharge. CV:  Denies chest pain or discomfort and palpitations. Resp:  Complains of cough and sputum productive; denies pleuritic, shortness of breath, and wheezing. GI:  Denies diarrhea, nausea, and vomiting. Derm:  Denies  rash.  Physical Exam  General:  Well-developed,well-nourished,in no acute distress; alert,appropriate and cooperative throughout examination Head:  normocephalic, atraumatic, and no abnormalities observed.  no sinus or temporal tenderness  Eyes:  vision grossly intact, pupils equal, pupils round, pupils reactive to light, and no injection.   Ears:  R ear normal.  L TM dull with effusion- no erythema or bulging  Nose:  nares are congested and injected  Mouth:  pharynx pink and moist, no erythema, and no exudates.   Neck:  No deformities, masses, or tenderness noted. Lungs:  Normal respiratory effort, chest expands symmetrically. Lungs are clear to auscultation, no crackles or wheezes. Heart:  Normal rate and regular rhythm. S1 and S2 normal without gallop, murmur, click, rub or other extra sounds. Skin:  Intact without suspicious lesions or rashes Cervical Nodes:  No lymphadenopathy noted Psych:  normal affect, talkative and pleasant    Impression & Recommendations:  Problem # 1:  URI (ICD-465.9) Assessment New  viral with st and head and chest congestion - and neg strep test  recommend sympt care- see pt instructions   fluids/rest  pt advised to update me if symptoms worsen or do not improve - esp if any facial pain or fever  given sample of nasonex also to try for congestion  Orders: Rapid Strep (96045)  Complete Medication List: 1)  Synthroid 50 Mcg Tabs (Levothyroxine sodium) .Marland Kitchen.. 1 by mouth once daily  Patient Instructions: 1)  drink lots of fluid  2)  zinc losenges - halls does a halls "  complete" 2-3 per day  3)  try nasonex 2 sprays in each nostril  once daily for 1-2 weeks  4)  try mucinex D in am , and plain mucinex in pm  5)  tylenol for sore throat or fever  6)  keep me updated if worse /sinus pain / fever or not starting to improve in a week   Current Allergies (reviewed today): ! Wilfred Curtis  Laboratory Results  Date/Time Received: December 11, 2009  12:36 PM  Date/Time Reported: December 11, 2009 12:36 PM   Other Tests  Rapid Strep: negative  Kit Test Internal QC: Positive   (Normal Range: Negative)

## 2010-11-06 NOTE — Progress Notes (Signed)
Summary: diarrhea  Phone Note Call from Patient Call back at [phone]   Caller: Patient Call For: Victoria Part MD Summary of Call: Pt came back from [place] on 08/04/10. Nausea and diarrhea started 08/07/10. Since last night pt going every hour with loose watery BM. Temp now is 98.  Both pt and her husband have same symptoms. 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  Initial call taken by: Lewanda Rife LPN,  August 08, 2010 10:17 AM  Follow-up for Phone Call        I don't think it is a bad idea- and if she worsens probably needs to go home  although this is likley food bourne -- cannot tell for sure so it just does not hurt to wear a mask  if worse -- may need appt as well make sure she is sipping fluids throughout the day to prevent dehydration Follow-up by: Victoria Part MD,  August 08, 2010 10:26 AM  Additional Follow-up for Phone Call Additional follow up Details #1::        Pt notified as instructed in person.Lewanda Rife LPN  August 08, 2010 10:57 AM

## 2010-11-06 NOTE — Letter (Signed)
Summary: [facility]   Imported By: Lanelle Bal 02/06/2010 12:34:54  _____________________________________________________________________  External Attachment:    Type:   Image     Comment:   External Document

## 2010-11-09 NOTE — Letter (Signed)
Summary: [facility]  [facility]   Imported By: Maryln Gottron 07/06/2010 13:47:29  _____________________________________________________________________  External Attachment:    Type:   Image     Comment:   External Document

## 2010-11-09 NOTE — Letter (Signed)
Summary: [facility]   Imported By: Sherian Rein 04/02/2010 08:29:39  _____________________________________________________________________  External Attachment:    Type:   Image     Comment:   External Document

## 2010-11-09 NOTE — Letter (Signed)
Summary: [facility]  [facility]   Imported By: Lanelle Bal 01/06/2010 09:31:07  _____________________________________________________________________  External Attachment:    Type:   Image     Comment:   External Document

## 2010-12-28 LAB — POCT PREGNANCY, URINE: Preg Test, Ur: NEGATIVE

## 2011-02-22 NOTE — Assessment & Plan Note (Signed)
[facility]                                   ON-CALL NOTE   Victoria Russell, Victoria Russell                        MRN:          [id number]  DATE:08/27/2006                            DOB:          Apr 28, 1986    The mother, [name], called on August 27, 2006, at 5:37 a.m., phone  [phone].  This is a patient of Dr. Milinda Antis.  The mother called saying her  daughter had woke up earlier this morning with some lower abdominal pain.  She said after she called the service her daughter said it was getting  better; it was not nearly as bad as it was a few hours ago.  Mom did not  know whether to take her to the emergency room or not.  No fever, no other  problems.  It started about an hour or two ago.  I recommended to mom that  if the pain was easing up and getting better that it was okay to wait until  this morning and call Dr. Royden Purl office to be evaluated, but if the pain  did return and come back as strong as it did when it started, that she  should take her daughter to the emergency room and not wait.     [name], DO  Electronically Signed    Shawnie Dapper  DD: 08/27/2006  DT: 08/27/2006  Job #: [id number]   cc:   Marne A. Milinda Antis, MD

## 2011-03-08 ENCOUNTER — Ambulatory Visit: Payer: Self-pay | Admitting: Family Medicine

## 2011-03-29 ENCOUNTER — Other Ambulatory Visit: Payer: Self-pay

## 2011-04-05 ENCOUNTER — Encounter: Payer: Self-pay | Admitting: Family Medicine

## 2011-04-05 ENCOUNTER — Other Ambulatory Visit: Payer: Self-pay

## 2011-04-12 ENCOUNTER — Encounter: Payer: Self-pay | Admitting: Family Medicine

## 2011-04-14 ENCOUNTER — Other Ambulatory Visit: Payer: Self-pay | Admitting: Family Medicine

## 2011-04-15 NOTE — Telephone Encounter (Signed)
[facility] request refill for Synthroid Name brand #[id number] with 3 refills given and note pt needs to call for appt.

## 2011-04-16 ENCOUNTER — Encounter: Payer: Self-pay | Admitting: Family Medicine

## 2011-04-16 ENCOUNTER — Ambulatory Visit (INDEPENDENT_AMBULATORY_CARE_PROVIDER_SITE_OTHER): Payer: PRIVATE HEALTH INSURANCE | Admitting: Family Medicine

## 2011-04-16 VITALS — BP 116/78 | HR 68 | Temp 98.2°F

## 2011-04-16 DIAGNOSIS — R51 Headache: Secondary | ICD-10-CM

## 2011-04-16 DIAGNOSIS — J029 Acute pharyngitis, unspecified: Secondary | ICD-10-CM

## 2011-04-16 LAB — POCT RAPID STREP A (OFFICE): Rapid Strep A Screen: NEGATIVE

## 2011-04-16 MED ORDER — HYDROCODONE-ACETAMINOPHEN 5-500 MG PO TABS: 1.0000 | ORAL_TABLET | Freq: Four times a day (QID) | ORAL | Status: AC | PRN

## 2011-04-16 MED ORDER — PROMETHAZINE HCL 12.5 MG PO TABS: 12.5000 mg | ORAL_TABLET | Freq: Four times a day (QID) | ORAL | Status: DC | PRN

## 2011-04-16 NOTE — Progress Notes (Signed)
Saturday- HA started.  Pain up the back of the neck.  Took ibuprofen/exceedrin w/o relief.  Throat sx- can swallow but throat is irritated.  Anterior neck muscles feel sore.  No fevers.  Mult sick exposures at work and with family.  Ear pain x2 prev.  No cough, no rhinorrhea.  No sputum.  Some nausea from the HA.  She is slightly photosensitive.  No h/o migraines.  Headache is constant since Saturday.  Frequent exercise prev but no trauma to explain the muscle pain  LMP 04/09/11.   RST negative.    Meds, vitals, and allergies reviewed.   ROS: See HPI.  Otherwise, noncontributory.  Nad, nontoxic ncat Perrl, eomi Tm wnl Nasal exam wnl Op with mild cobblestoning Neck supple, no dec in rom, diffusely ttp posteriorly across the paraspinal muscles and up to the occiput Anterior neck ttp along the thyroid and the strap muscles B but no LA noted.  No stridor.  No fluctuant mass.  rrr ctab CN 2-12 wnl B, S/S/DTR wnl x4

## 2011-04-16 NOTE — Patient Instructions (Signed)
Get some rest, take the vicodin for pain and the phenergan for nausea.  Both can make you drowsy.  We'll contact you with your lab report.  Take care.  This should gradually improve.

## 2011-04-17 ENCOUNTER — Telehealth: Payer: Self-pay | Admitting: *Deleted

## 2011-04-17 DIAGNOSIS — R519 Headache, unspecified: Secondary | ICD-10-CM | POA: Insufficient documentation

## 2011-04-17 DIAGNOSIS — R51 Headache: Secondary | ICD-10-CM | POA: Insufficient documentation

## 2011-04-17 NOTE — Telephone Encounter (Signed)
I called Victoria Russell.  Her HA was some better, the nausea was controlled and the vision changes had resolved.  She had no weakness or other focal neuro changes.  No vision loss.  She was resting on the cough.  Speech was fluent.  She still has some muscle soreness.  I talked to her about options. She is improved over earlier this AM.  I think it is likely that she had a migraine and bringing her to the clinic would for recheck now would likely be counterproductive.  She agreed and I gave her my contact information in case her situation changed.  She'll use the phenergan and vicodin prn and call back as needed.  I will forward to [employer] as a FYI.

## 2011-04-17 NOTE — Telephone Encounter (Signed)
Late entry.  I had called pt earlier tonight and she was feeling much better.  She will likely feel well enough to return to work tomorrow.  I will await update from patient as needed.

## 2011-04-17 NOTE — Assessment & Plan Note (Signed)
I question if she has a migraine triggered by a URI/viral process.  There have been mult sick contacts in the community.  The myalgias in the paraspinal muscles could have caused pain, triggered migraine sx.  She is nontoxic and I don't see a focal source o/w.  She has no thyroid asymmetry.  Will check TSH given the exam, but I don't feel an isolated mass that would need further exam at this point.  Supportive tx in meantime and call back as needed.  Out of work in meantime.  She agrees.

## 2011-04-17 NOTE — Telephone Encounter (Signed)
Patient called this AM and is still experiencing horrible head/neck pain. She said she took a vicodin yesterday when she got home with no relief at all. She took another around 10pm and went to bed. She awoke this AM and her head is still hurting her worse than yesterday. She said her face is painful and she is having blurry vision due to the pain. No vomiting. She said the phenergan helped with that. She is really concerned and starting to get really worried since the vicodin hasn't helped at all. I told her to take another vicodin and go back to sleep if possible. I told her I would call her once I hear from you. She is aware that you're not here until this afternoon, but she thought you may check your messages before coming in. Do you want her to come back in today? (I also sent this to Dr. Milinda Antis just in case you didn't get this before you came in and also for FYI.)

## 2011-05-13 ENCOUNTER — Ambulatory Visit (INDEPENDENT_AMBULATORY_CARE_PROVIDER_SITE_OTHER): Payer: PRIVATE HEALTH INSURANCE | Admitting: Family Medicine

## 2011-05-13 ENCOUNTER — Encounter: Payer: Self-pay | Admitting: Family Medicine

## 2011-05-13 DIAGNOSIS — M545 Low back pain: Secondary | ICD-10-CM

## 2011-05-13 DIAGNOSIS — R3989 Other symptoms and signs involving the genitourinary system: Secondary | ICD-10-CM

## 2011-05-13 DIAGNOSIS — R109 Unspecified abdominal pain: Secondary | ICD-10-CM

## 2011-05-13 LAB — POCT URINALYSIS DIPSTICK
Ketones, UA: NEGATIVE
Protein, UA: UNDETERMINED
Spec Grav, UA: 1.02
Urobilinogen, UA: 0.2
pH, UA: 6.5

## 2011-05-13 MED ORDER — CIPROFLOXACIN HCL 500 MG PO TABS: 500.0000 mg | ORAL_TABLET | Freq: Two times a day (BID) | ORAL | Status: AC

## 2011-05-13 NOTE — Patient Instructions (Signed)
Looks like possible urinary infection.  Treat with cipro twice daily for 5 days. If not improving, please follow up with urology or let us know if unable to get in to see them. Push fluids and rest.

## 2011-05-13 NOTE — Progress Notes (Signed)
Addended by: Josph Macho A on: 05/13/2011 01:51 PM   Modules accepted: Orders

## 2011-05-13 NOTE — Progress Notes (Signed)
  Subjective:    Patient ID: Victoria Russell, female    DOB: 07/05/1986, 25 y.o.   MRN: [id number]  HPI CC: ?UTI  1-2 mo ago started having HA.  Also started having lower back pain.  When working on abs, worse in front.  This weekend started feeling pressure suprapubically and also lower back.  Main concern is lower back pressure.  Also mild dysuria when voiding.  Has never had UTI before. + nausea, thinks from Azo.  Took Azo starting 2d ago.  + urgency and mild polyuria.  Denies flank pain, fevers/chills.  No vomiting.  No blood in stool.  Doesn't feel like typical kidney stones.  H/o kidney stones in past, last issue 3-01/2011, s/p cystoscopy.  LMP last week.  Review of Systems Per HPI.    Objective:   Physical Exam  Nursing note and vitals reviewed. Constitutional: She appears well-developed and well-nourished. No distress.  HENT:  Mouth/Throat: Oropharynx is clear and moist. No oropharyngeal exudate.  Abdominal: Soft. Bowel sounds are normal. She exhibits no distension and no mass. There is tenderness (mild) in the right lower quadrant, periumbilical area, suprapubic area, left upper quadrant and left lower quadrant. There is CVA tenderness (mild R). There is no rigidity, no rebound and no guarding.          Assessment & Plan:

## 2011-05-13 NOTE — Assessment & Plan Note (Addendum)
H/o kidney stones, but not consistent with this currently. Story consistent with UTI.  Micro not as consistent.  Sent culture, treat with abx while await culture results. If not improved after abx, rec f/u with urology. upreg today.

## 2011-06-14 ENCOUNTER — Ambulatory Visit (INDEPENDENT_AMBULATORY_CARE_PROVIDER_SITE_OTHER): Payer: PRIVATE HEALTH INSURANCE | Admitting: Family Medicine

## 2011-06-14 ENCOUNTER — Encounter: Payer: Self-pay | Admitting: Family Medicine

## 2011-06-14 VITALS — BP 102/84 | HR 80 | Temp 97.5°F | Wt 140.0 lb

## 2011-06-14 DIAGNOSIS — R002 Palpitations: Secondary | ICD-10-CM | POA: Insufficient documentation

## 2011-06-14 HISTORY — DX: Palpitations: R00.2

## 2011-06-14 LAB — CBC WITH DIFFERENTIAL/PLATELET
Basophils Absolute: 0 10*3/uL (ref 0.0–0.1)
Eosinophils Relative: 1.8 % (ref 0.0–5.0)
HCT: 43.6 % (ref 36.0–46.0)
Lymphs Abs: 2.5 10*3/uL (ref 0.7–4.0)
MCHC: 33.2 g/dL (ref 30.0–36.0)
MCV: 95 fl (ref 78.0–100.0)
Neutrophils Relative %: 44.7 % (ref 43.0–77.0)
Platelets: 211 10*3/uL (ref 150.0–400.0)
RDW: 12.7 % (ref 11.5–14.6)

## 2011-06-14 LAB — COMPREHENSIVE METABOLIC PANEL
ALT: 32 U/L (ref 0–35)
AST: 22 U/L (ref 0–37)
Albumin: 4.3 g/dL (ref 3.5–5.2)
Alkaline Phosphatase: 56 U/L (ref 39–117)
Glucose, Bld: 83 mg/dL (ref 70–99)
Potassium: 3.8 mEq/L (ref 3.5–5.1)
Sodium: 139 mEq/L (ref 135–145)
Total Bilirubin: 0.6 mg/dL (ref 0.3–1.2)
Total Protein: 7.4 g/dL (ref 6.0–8.3)

## 2011-06-14 LAB — POCT URINE PREGNANCY: Preg Test, Ur: NEGATIVE

## 2011-06-14 NOTE — Progress Notes (Signed)
She was at the gym ~05/18/11 and her heart started pounding during exercise.  She felt like she was going to pass out.  This didn't feel like a WPW event.  Her vision was blurry and dark.  She didn't pass out.  She was able to calm herself down.  It lasted a few minutes.  No other episodes of similar since the event, but she can tense up and get worried about it happening again.  She'll get palpitations if she gets anxious, even at rest.  She hasn't had troubles with anxiety historically.  She has persistent fatigue over the last month.  She has persistent anterior neck symptoms (a "tense" feeling in throat), over the last month.  She has a mild sensation of dizziness that is worse with position change.    No tick bites.  No fevers.  No swelling.  Doing okay at home.  Husband's treating her well.  Sleeping okay, but needs melatonin to get to sleep.   PMH and SH reviewed  ROS: See HPI, otherwise noncontributory.  Meds, vitals, and allergies reviewed.   GEN: nad, alert and oriented HEENT: mucous membranes moist, op wnl NECK: supple w/o LA, no TMG noted CV: rrr.  no murmur PULM: ctab, no inc wob EXT: no edema SKIN: no acute rash CN 2-12 wnl B, S/S/DTR wnl x4  EKG unremarkable Upreg neg

## 2011-06-14 NOTE — Assessment & Plan Note (Addendum)
EKG unremarkable.  No short PR interval.  Will check basic labs.  She may need cards eval for consideration of holter.  Okay for outpatient f/u in meantime. She agrees with plan.  I am unsure if anxiety or thyroid abnormality is related to her sx.  I am unsure if her anterior neck sx are muscle strain/exercise related.  I've asked her to limit strain to the area.  No stridor.  She agrees.  >25 min spent with face to face with patient, >50% counseling.

## 2011-06-14 NOTE — Patient Instructions (Signed)
You can get your results through our phone system.  Follow the instructions on the blue card. You may need to talk with the cardiology clinic.  We can decide this after we get your labs back.   Take care.

## 2011-06-15 ENCOUNTER — Telehealth: Payer: Self-pay | Admitting: Family Medicine

## 2011-06-15 DIAGNOSIS — R002 Palpitations: Secondary | ICD-10-CM

## 2011-06-15 NOTE — Telephone Encounter (Signed)
Please notify that all of her labs are fine.  With the symptoms she had, I'd prefer her to talk to cards.  I put in the referral. Please forward the note to [facility] as a FYI.  Thanks.

## 2011-06-18 NOTE — Telephone Encounter (Signed)
Patient advised.

## 2011-06-21 ENCOUNTER — Ambulatory Visit: Payer: PRIVATE HEALTH INSURANCE | Admitting: Family Medicine

## 2011-06-26 ENCOUNTER — Encounter: Payer: PRIVATE HEALTH INSURANCE | Admitting: Family Medicine

## 2011-07-01 LAB — COMPREHENSIVE METABOLIC PANEL
AST: 23
BUN: 6
CO2: 24
Chloride: 103
Creatinine, Ser: 0.87
GFR calc non Af Amer: >60
Glucose, Bld: 103 — ABNORMAL HIGH
Total Bilirubin: 0.6

## 2011-07-01 LAB — URINALYSIS, ROUTINE W REFLEX MICROSCOPIC
Bilirubin Urine: NEGATIVE
Glucose, UA: NEGATIVE
Ketones, ur: NEGATIVE
Leukocytes, UA: NEGATIVE
Nitrite: NEGATIVE
Specific Gravity, Urine: 1.01
Urobilinogen, UA: 0.2
pH: 8.5 — ABNORMAL HIGH
pH: 6.5

## 2011-07-01 LAB — CBC
HCT: 43.4
Hemoglobin: 15
MCHC: 34.5
MCV: 90.3
RBC: 4.81
WBC: 9.8

## 2011-07-01 LAB — URINE MICROSCOPIC-ADD ON

## 2011-07-01 LAB — POCT PREGNANCY, URINE: Preg Test, Ur: NEGATIVE

## 2011-07-04 ENCOUNTER — Ambulatory Visit: Payer: PRIVATE HEALTH INSURANCE | Admitting: Family Medicine

## 2011-08-02 ENCOUNTER — Ambulatory Visit: Payer: PRIVATE HEALTH INSURANCE | Admitting: Family Medicine

## 2011-08-23 ENCOUNTER — Other Ambulatory Visit: Payer: Self-pay | Admitting: *Deleted

## 2011-08-23 MED ORDER — SYNTHROID 50 MCG PO TABS: ORAL_TABLET | ORAL | Status: DC

## 2011-08-26 ENCOUNTER — Other Ambulatory Visit: Payer: Self-pay | Admitting: Family Medicine

## 2011-08-26 ENCOUNTER — Ambulatory Visit: Payer: PRIVATE HEALTH INSURANCE | Admitting: Family Medicine

## 2011-08-26 MED ORDER — FLUOCINONIDE 0.05 % EX CREA: TOPICAL_CREAM | CUTANEOUS | Status: DC

## 2011-08-26 NOTE — Telephone Encounter (Signed)
Pt came to my office, has very itchy rash on back on bilateral flank. Raised, linear, very typical of poison ivy or other plant type of dermatitis. Husband is a Visual merchandiser and outdoors quite a bit.  Will try topical lidex, Benadryl or other antihistamines as needed for itching.

## 2011-09-20 ENCOUNTER — Ambulatory Visit: Payer: PRIVATE HEALTH INSURANCE | Admitting: Cardiovascular Disease

## 2011-10-21 ENCOUNTER — Telehealth: Payer: Self-pay | Admitting: Family Medicine

## 2011-10-21 NOTE — Telephone Encounter (Signed)
This is a TEST.

## 2011-10-24 ENCOUNTER — Other Ambulatory Visit: Payer: Self-pay | Admitting: *Deleted

## 2011-10-24 MED ORDER — LEVOTHYROXINE SODIUM 50 MCG PO TABS: 50.0000 ug | ORAL_TABLET | Freq: Every day | ORAL | Status: DC

## 2011-10-24 NOTE — Telephone Encounter (Signed)
Patient will be out of her brand name Synthroid today and Dr. Milinda Antis will not be back in the office until tomorrow but patient wants to switch to generic Synthroid so it will be cheaper.  She would like Rx sent to [facility].  Is this ok?  Please advise.

## 2011-10-24 NOTE — Telephone Encounter (Signed)
Levothyroxine sent to [facility], patient advised.

## 2011-10-24 NOTE — Telephone Encounter (Signed)
Yes ok to send.  

## 2011-11-15 ENCOUNTER — Ambulatory Visit: Payer: PRIVATE HEALTH INSURANCE | Admitting: Family Medicine

## 2011-11-15 ENCOUNTER — Encounter: Payer: Self-pay | Admitting: Family Medicine

## 2011-11-15 ENCOUNTER — Ambulatory Visit (INDEPENDENT_AMBULATORY_CARE_PROVIDER_SITE_OTHER): Payer: 59 | Admitting: Family Medicine

## 2011-11-15 VITALS — BP 106/64 | HR 64 | Temp 98.2°F | Ht 64.5 in | Wt 140.5 lb

## 2011-11-15 DIAGNOSIS — Z01419 Encounter for gynecological examination (general) (routine) without abnormal findings: Secondary | ICD-10-CM | POA: Insufficient documentation

## 2011-11-15 DIAGNOSIS — E039 Hypothyroidism, unspecified: Secondary | ICD-10-CM

## 2011-11-15 DIAGNOSIS — Z3169 Encounter for other general counseling and advice on procreation: Secondary | ICD-10-CM | POA: Insufficient documentation

## 2011-11-15 LAB — TSH: TSH: 0.57 u[IU]/mL (ref 0.35–5.50)

## 2011-11-15 NOTE — Assessment & Plan Note (Signed)
Pt changed to generic synthroid and feels fine Disc the tighter range of tsh control when trying to get pregnant (1-3) Will check today

## 2011-11-15 NOTE — Progress Notes (Signed)
Subjective:    Patient ID: Victoria Russell, female    DOB: [birth year], 26 y.o.   MRN: [id number]  HPI Here for hypothyroid and also pre conception counseling  Is doing well overall   Dr Dayton Martes switched her back to generic thyroid med Needs to monitor it  Feels fine No skin or hair change / nl wt and no change in energy   Started trying to get pregnant after august  6 mo  Crazy cycle in dec -- had 2 menses - has done preg tests  Is not using birth control Counts days of her cycle  Is using ovulation kit and ovulates day 14 pretty reliably   Husband has low testosterone - and takes the shots    No hx of PID or STDs  Mother had a hx of frequent ectopic preg or miscarriages   Is feeling good overall  Some stress Mother dx with breast cancer / father broke his leg - and gpa inhospice Is taking care of them   Patient Active Problem List  Diagnoses  . HYPOTHYROIDISM  . HYPERLIPIDEMIA  . DYSMENORRHEA  . ABDOMINAL PAIN, SUPRAPUBIC  . RENAL CALCULUS, HX OF  . Headache  . Palpitations  . Gynecological examination  . Pre-conception counseling   Past Medical History  Diagnosis Date  . ADHD (attention deficit hyperactivity disorder)   . Hypothyroidism   . Hyperhidrosis   . WPW (Wolff-Parkinson-White syndrome)   . Atypical moles     dysplasia  . History of kidney stones   . Right ureteral stone 3/09  . Dysmenorrhea   . HLD (hyperlipidemia)    Past Surgical History  Procedure Date  . Tonsillectomy and adenoidectomy   . Myringotomy   . Mole removal     dysplastic   History  Substance Use Topics  . Smoking status: Never Smoker   . Smokeless tobacco: Not on file  . Alcohol Use: Yes     Social   Family History  Problem Relation Age of Onset  . Evelene Croon Parkinson White syndrome Mother   . Hypothyroidism Mother   . Basal cell carcinoma Mother   . Cancer Mother     basal cell skin CA  . Diabetes Maternal Grandfather   . Cervical cancer      MGGM  . Cervical cancer  Maternal Grandmother   . Cancer Maternal Grandmother     cervical CA  . Coronary artery disease Paternal Grandmother   . Heart disease Paternal Grandmother     CAD  . Other Sister     Dermoid cyst  . Cancer Other     cervical CA   Allergies  Allergen Reactions  . Synthroid (Levothyroxine Sodium)     Generic does not work for her. Name brand required.    Current Outpatient Prescriptions on File Prior to Visit  Medication Sig Dispense Refill  . levothyroxine (SYNTHROID, LEVOTHROID) 50 MCG tablet Take 1 tablet (50 mcg total) by mouth daily.  30 tablet  12  . fluocinonide cream (LIDEX) 0.05 % Apply to affected area daily  30 g  1  . Melatonin (RA MELATONIN) 10 MG TABS Take 1 tablet by mouth at bedtime as needed.               Review of Systems Review of Systems  Constitutional: Negative for fever, appetite change, fatigue and unexpected weight change.  Eyes: Negative for pain and visual disturbance.  Respiratory: Negative for cough and shortness of breath.   Cardiovascular: Negative  for cp or palpitations   no LE edema  Gastrointestinal: Negative for nausea, diarrhea and constipation.  Genitourinary: Negative for urgency and frequency.  Skin: Negative for pallor or rash   Neurological: Negative for weakness, light-headedness, numbness and headaches. neg for tremor  Hematological: Negative for adenopathy. Does not bruise/bleed easily.  Psychiatric/Behavioral: Negative for dysphoric mood. The patient is not nervous/anxious.  pos for significant stress         Objective:   Physical Exam  Constitutional: She appears well-developed and well-nourished. No distress.  HENT:  Head: Normocephalic and atraumatic.  Mouth/Throat: Oropharynx is clear and moist.  Eyes: Conjunctivae and EOM are normal. Pupils are equal, round, and reactive to light. No scleral icterus.  Neck: Normal range of motion. Neck supple. No JVD present. Carotid bruit is not present. No thyromegaly present.    Cardiovascular: Normal rate, regular rhythm, normal heart sounds and intact distal pulses.   No murmur heard. Pulmonary/Chest: Effort normal and breath sounds normal. No respiratory distress. She has no wheezes.  Abdominal: Soft. Bowel sounds are normal. She exhibits no distension and no mass.       No suprapubic tenderness  Or fullness  Musculoskeletal: She exhibits no edema.  Lymphadenopathy:    She has no cervical adenopathy.  Neurological: She is alert. She has normal reflexes. No cranial nerve deficit. She exhibits normal muscle tone. Coordination normal.  Skin: Skin is warm and dry. No erythema. No pallor.  Psychiatric: She has a normal mood and affect.       Cheerful and talkative          Assessment & Plan:

## 2011-11-15 NOTE — Assessment & Plan Note (Signed)
Disc health habits/ thyroid tx/ method of ovulation testing Role of stress She will have husb checked in light of testosterone def Will re to OBGYN to get est inst to get PNV and start now

## 2011-11-15 NOTE — Patient Instructions (Signed)
Thyroid lab today  We will refer you for OBGYN at check out  Try to stay healthy - diet / exercise Keep tracking ovulation/ counting days in cycle  Avoid alcohol and over the counter medicines Pick up some prenatal vitamins one a day

## 2011-11-15 NOTE — Assessment & Plan Note (Signed)
Ref to obgyn to get est and have annual exam since she is planning to conceive

## 2011-11-18 ENCOUNTER — Telehealth: Payer: Self-pay | Admitting: Family Medicine

## 2011-11-18 MED ORDER — LEVOTHYROXINE SODIUM 25 MCG PO TABS: 25.0000 ug | ORAL_TABLET | Freq: Every day | ORAL | Status: DC

## 2011-11-18 NOTE — Telephone Encounter (Signed)
tsh is in nl limits but as we disc - optimal range is 1-3 , so I am going to decrease her dose from 50 to 25 mcg to see if that helps Px written for call in  Or send elect Re check tsh in 6 weeks please for hypothyroid

## 2011-11-18 NOTE — Telephone Encounter (Signed)
Medication phoned to [facility] as instructed. Patient notified as instructed by telephone.Lab appt scheduled as instructed 12/31/11 at 10am.

## 2011-12-24 ENCOUNTER — Other Ambulatory Visit: Payer: Self-pay | Admitting: Family Medicine

## 2011-12-24 MED ORDER — AMOXICILLIN-POT CLAVULANATE 875-125 MG PO TABS: 1.0000 | ORAL_TABLET | Freq: Two times a day (BID) | ORAL | Status: AC

## 2011-12-24 MED ORDER — HYDROCOD POLST-CHLORPHEN POLST 10-8 MG/5ML PO LQCR: 5.0000 mL | Freq: Every evening | ORAL | Status: DC | PRN

## 2011-12-27 ENCOUNTER — Other Ambulatory Visit: Payer: Self-pay | Admitting: Family Medicine

## 2011-12-27 DIAGNOSIS — E039 Hypothyroidism, unspecified: Secondary | ICD-10-CM

## 2011-12-30 ENCOUNTER — Ambulatory Visit: Payer: 59 | Admitting: Family Medicine

## 2011-12-31 ENCOUNTER — Other Ambulatory Visit (INDEPENDENT_AMBULATORY_CARE_PROVIDER_SITE_OTHER): Payer: 59

## 2011-12-31 DIAGNOSIS — E039 Hypothyroidism, unspecified: Secondary | ICD-10-CM

## 2011-12-31 LAB — TSH: TSH: 0.85 u[IU]/mL (ref 0.35–5.50)

## 2012-01-02 ENCOUNTER — Telehealth: Payer: Self-pay | Admitting: Family Medicine

## 2012-01-02 DIAGNOSIS — E039 Hypothyroidism, unspecified: Secondary | ICD-10-CM

## 2012-01-02 NOTE — Telephone Encounter (Signed)
Patient advised as instructed via telephone. 

## 2012-01-02 NOTE — Telephone Encounter (Signed)
I want tsh a bit higher - between 1 and 3 --- so I want her to cut her 25 mcg pill in 1/2 and take a half pill daily  Re check tsh in 1 month please

## 2012-01-15 ENCOUNTER — Other Ambulatory Visit: Payer: Self-pay | Admitting: Obstetrics and Gynecology

## 2012-01-20 ENCOUNTER — Other Ambulatory Visit (HOSPITAL_COMMUNITY): Payer: Self-pay | Admitting: Obstetrics and Gynecology

## 2012-01-20 DIAGNOSIS — N971 Female infertility of tubal origin: Secondary | ICD-10-CM

## 2012-01-28 ENCOUNTER — Ambulatory Visit (HOSPITAL_COMMUNITY): Payer: 59

## 2012-01-31 ENCOUNTER — Other Ambulatory Visit (INDEPENDENT_AMBULATORY_CARE_PROVIDER_SITE_OTHER): Payer: 59

## 2012-01-31 DIAGNOSIS — E039 Hypothyroidism, unspecified: Secondary | ICD-10-CM

## 2012-01-31 LAB — TSH: TSH: 2.27 u[IU]/mL (ref 0.35–5.50)

## 2012-03-03 ENCOUNTER — Encounter: Payer: Self-pay | Admitting: Family Medicine

## 2012-03-03 ENCOUNTER — Ambulatory Visit (INDEPENDENT_AMBULATORY_CARE_PROVIDER_SITE_OTHER): Payer: 59 | Admitting: Family Medicine

## 2012-03-03 VITALS — BP 110/80 | HR 64 | Temp 98.7°F | Ht 64.0 in | Wt 134.0 lb

## 2012-03-03 DIAGNOSIS — N390 Urinary tract infection, site not specified: Secondary | ICD-10-CM | POA: Insufficient documentation

## 2012-03-03 DIAGNOSIS — R300 Dysuria: Secondary | ICD-10-CM

## 2012-03-03 LAB — POCT URINALYSIS DIPSTICK
Bilirubin, UA: NEGATIVE
Nitrite, UA: NEGATIVE
pH, UA: 7

## 2012-03-03 MED ORDER — SULFAMETHOXAZOLE-TRIMETHOPRIM 800-160 MG PO TABS: 1.0000 | ORAL_TABLET | Freq: Two times a day (BID) | ORAL | Status: AC

## 2012-03-03 NOTE — Patient Instructions (Signed)
Drink lots of water  Take septra - be careful in the sun  Update me if not starting to improve in several days We will alert you when culture returns

## 2012-03-03 NOTE — Progress Notes (Signed)
Subjective:    Patient ID: Victoria Russell, female    DOB: December 04, 1985, 26 y.o.   MRN: [id number]  HPI Here for uti  ua today is positive  Start low back pain over the weekend - ? From exercise  Now burning to urinate No blood  No fever or nausea   LMP was may the 9th  Not using birth control   Is drinking lots of water  Has azo otc to take for discomfort No med allergies    Patient Active Problem List  Diagnoses  . HYPOTHYROIDISM  . HYPERLIPIDEMIA  . DYSMENORRHEA  . RENAL CALCULUS, HX OF  . Headache  . Palpitations  . Gynecological examination  . Pre-conception counseling  . UTI (lower urinary tract infection)   Past Medical History  Diagnosis Date  . ADHD (attention deficit hyperactivity disorder)   . Hypothyroidism   . Hyperhidrosis   . WPW (Wolff-Parkinson-White syndrome)   . Atypical moles     dysplasia  . History of kidney stones   . Right ureteral stone 3/09  . Dysmenorrhea   . HLD (hyperlipidemia)    Past Surgical History  Procedure Date  . Tonsillectomy and adenoidectomy   . Myringotomy   . Mole removal     dysplastic   History  Substance Use Topics  . Smoking status: Never Smoker   . Smokeless tobacco: Not on file  . Alcohol Use: Yes     Social   Family History  Problem Relation Age of Onset  . Evelene Croon Parkinson White syndrome Mother   . Hypothyroidism Mother   . Basal cell carcinoma Mother   . Cancer Mother     basal cell skin CA  . Diabetes Maternal Grandfather   . Cervical cancer      MGGM  . Cervical cancer Maternal Grandmother   . Cancer Maternal Grandmother     cervical CA  . Coronary artery disease Paternal Grandmother   . Heart disease Paternal Grandmother     CAD  . Other Sister     Dermoid cyst  . Cancer Other     cervical CA   No Known Allergies Current Outpatient Prescriptions on File Prior to Visit  Medication Sig Dispense Refill  . levothyroxine (SYNTHROID, LEVOTHROID) 25 MCG tablet Take 12.5 mcg by mouth daily.          Review of Systems Review of Systems  Constitutional: Negative for fever, appetite change, fatigue and unexpected weight change.  Eyes: Negative for pain and visual disturbance.  Respiratory: Negative for cough and shortness of breath.   Cardiovascular: Negative for cp or palpitations    Gastrointestinal: Negative for nausea, diarrhea and constipation.  Genitourinary: pos for urgency and frequency and dysuria/ neg for blood  Skin: Negative for pallor or rash   Neurological: Negative for weakness, light-headedness, numbness and headaches.  Hematological: Negative for adenopathy. Does not bruise/bleed easily.  Psychiatric/Behavioral: Negative for dysphoric mood. The patient is not nervous/anxious.         Objective:   Physical Exam  Constitutional: She appears well-developed and well-nourished. No distress.  HENT:  Head: Normocephalic and atraumatic.  Eyes: Conjunctivae and EOM are normal. Pupils are equal, round, and reactive to light.  Neck: Neck supple.  Cardiovascular: Normal rate and regular rhythm.   Pulmonary/Chest: Effort normal and breath sounds normal.  Abdominal: Soft. Bowel sounds are normal. She exhibits no distension. There is tenderness.       Mild suprapubic tenderness without rebound or gaurding  Musculoskeletal:       No cva tenderness  Neurological: She is alert.  Skin: Skin is warm and dry.  Psychiatric: She has a normal mood and affect.          Assessment & Plan:

## 2012-03-03 NOTE — Assessment & Plan Note (Signed)
Uncomplicated  Will push water  Take septra as directed Urine cx sent  Update if not starting to improve in a week or if worsening

## 2012-03-05 LAB — POCT UA - MICROSCOPIC ONLY: Casts, Ur, LPF, POC: 0

## 2012-03-05 LAB — URINE CULTURE

## 2012-04-13 ENCOUNTER — Ambulatory Visit (INDEPENDENT_AMBULATORY_CARE_PROVIDER_SITE_OTHER): Payer: 59 | Admitting: Family Medicine

## 2012-04-13 VITALS — BP 117/73 | HR 49 | Temp 97.8°F

## 2012-04-13 DIAGNOSIS — D485 Neoplasm of uncertain behavior of skin: Secondary | ICD-10-CM | POA: Insufficient documentation

## 2012-04-13 NOTE — Assessment & Plan Note (Signed)
3-4 mm slt raised nevus lateral lower R leg - with some tan color  Dermatofibroma is in differential but cannot r/o atypia due to asymmetry and recent change Removed by shave bx and sent for path After care discussed  Will alert when path returns

## 2012-04-13 NOTE — Progress Notes (Signed)
Subjective:    Patient ID: Victoria Russell, female    DOB: [birth year], 26 y.o.   MRN: [id number]  HPI Here for raised lesion on R leg Was flat at first - small amt of brown color to it   Is a change  occ shaves over it  She has had atypical moles in the past- sees Dr [place]  Patient Active Problem List  Diagnosis  . HYPOTHYROIDISM  . HYPERLIPIDEMIA  . DYSMENORRHEA  . RENAL CALCULUS, HX OF  . Headache  . Palpitations  . Gynecological examination  . Pre-conception counseling  . UTI (lower urinary tract infection)  . Neoplasm of uncertain behavior of skin   Past Medical History  Diagnosis Date  . ADHD (attention deficit hyperactivity disorder)   . Hypothyroidism   . Hyperhidrosis   . WPW (Wolff-Parkinson-White syndrome)   . Atypical moles     dysplasia  . History of kidney stones   . Right ureteral stone 3/09  . Dysmenorrhea   . HLD (hyperlipidemia)    Past Surgical History  Procedure Date  . Tonsillectomy and adenoidectomy   . Myringotomy   . Mole removal     dysplastic   History  Substance Use Topics  . Smoking status: Never Smoker   . Smokeless tobacco: Not on file  . Alcohol Use: Yes     Social   Family History  Problem Relation Age of Onset  . Evelene Croon Parkinson White syndrome Mother   . Hypothyroidism Mother   . Basal cell carcinoma Mother   . Cancer Mother     basal cell skin CA  . Diabetes Maternal Grandfather   . Cervical cancer      MGGM  . Cervical cancer Maternal Grandmother   . Cancer Maternal Grandmother     cervical CA  . Coronary artery disease Paternal Grandmother   . Heart disease Paternal Grandmother     CAD  . Other Sister     Dermoid cyst  . Cancer Other     cervical CA   No Known Allergies Current Outpatient Prescriptions on File Prior to Visit  Medication Sig Dispense Refill  . levothyroxine (SYNTHROID, LEVOTHROID) 25 MCG tablet Take 12.5 mcg by mouth daily.          Review of Systems Review of Systems  Constitutional:  Negative for fever, appetite change, fatigue and unexpected weight change.  Eyes: Negative for pain and visual disturbance.  Respiratory: Negative for cough and shortness of breath.   Cardiovascular: Negative for cp or palpitations    Gastrointestinal: Negative for nausea, diarrhea and constipation.  Genitourinary: Negative for urgency and frequency.  Skin: Negative for pallor or rash   Neurological: Negative for weakness, light-headedness, numbness and headaches.  Hematological: Negative for adenopathy. Does not bruise/bleed easily.  Psychiatric/Behavioral: Negative for dysphoric mood. The patient is not nervous/anxious.         Objective:   Physical Exam  Constitutional: She appears well-developed and well-nourished. No distress.  HENT:  Head: Normocephalic and atraumatic.  Eyes: Conjunctivae and EOM are normal. Pupils are equal, round, and reactive to light.  Cardiovascular: Regular rhythm.   Skin: Skin is warm and dry. No rash noted. No erythema. No pallor.       Lateral R leg 3-4 mm raised tan lesion with some asymmetric color  Lesion was anesth with 1 % xylocaine with epi 1 cc Then prepped and draped in a sterile fashion  Removed with #11 scalpel with shave technique Hemostasis with silver  nitrate stick Pt tolerated procedure well  abx oint and band aid applied   Psychiatric: She has a normal mood and affect.          Assessment & Plan:

## 2012-04-13 NOTE — Patient Instructions (Addendum)
Keep wound clean and dry  Use antibacterial soap and water to clean  Antibacterial ointment and bandaid until healed Don't submerge until healed If pain or redness/ let me know  Will update when pathology returns

## 2012-06-09 ENCOUNTER — Encounter: Payer: Self-pay | Admitting: Family Medicine

## 2012-06-09 ENCOUNTER — Ambulatory Visit: Payer: 59 | Admitting: Family Medicine

## 2012-06-09 ENCOUNTER — Ambulatory Visit (INDEPENDENT_AMBULATORY_CARE_PROVIDER_SITE_OTHER): Payer: 59 | Admitting: Family Medicine

## 2012-06-09 VITALS — BP 120/72 | HR 70 | Temp 97.9°F | Resp 16 | Wt 137.5 lb

## 2012-06-09 DIAGNOSIS — Z3490 Encounter for supervision of normal pregnancy, unspecified, unspecified trimester: Secondary | ICD-10-CM | POA: Insufficient documentation

## 2012-06-09 DIAGNOSIS — Z331 Pregnant state, incidental: Secondary | ICD-10-CM

## 2012-06-09 DIAGNOSIS — Z3201 Encounter for pregnancy test, result positive: Secondary | ICD-10-CM

## 2012-06-09 NOTE — Assessment & Plan Note (Signed)
New pregnancy - just over 4 weeks Doing well overall- no complications Will start PNV See AVs for other inst  Ref to obgyn

## 2012-06-09 NOTE — Progress Notes (Signed)
Subjective:    Patient ID: ZAYLI VILLAFUERTE, female    DOB: 03/12/1986, 26 y.o.   MRN: [id number]  HPI Did 3 home preg tests at home  One here was pos also   Started July 31 LMP  Would be about 4 and 4/[redacted] weeks pregnant , and may 7th would be tentative due date   Never skipped a period before  Is very excited  No spotting at all  Does exercise regularly -- 7-10 miles on elliptical (1-2 hours)   Is pretty tired  Not sleeping very well  Frequent urination - esp at night  Only a little breast pain   Feels a little nausea Very sensitive to smell   Sister is pregnant too - 6 months along - also excited about this  Lots of family support  Has several books -incl What to expect when expecting- is starting to read that   No alcohol  No meds but synthroid  Finishing steroid drops for a corneal ulcer after getting sand in it -- doing fine with that now   Is not on PNV   Sees Dr Vincente Poli for OBGYN  Needs appt for first preg visit   Patient Active Problem List  Diagnosis  . HYPOTHYROIDISM  . HYPERLIPIDEMIA  . DYSMENORRHEA  . RENAL CALCULUS, HX OF  . Headache  . Palpitations  . Gynecological examination  . Pre-conception counseling  . UTI (lower urinary tract infection)  . Neoplasm of uncertain behavior of skin  . Pregnancy   Past Medical History  Diagnosis Date  . ADHD (attention deficit hyperactivity disorder)   . Hypothyroidism   . Hyperhidrosis   . WPW (Wolff-Parkinson-White syndrome)   . Atypical moles     dysplasia  . History of kidney stones   . Right ureteral stone 3/09  . Dysmenorrhea   . HLD (hyperlipidemia)    Past Surgical History  Procedure Date  . Tonsillectomy and adenoidectomy   . Myringotomy   . Mole removal     dysplastic   History  Substance Use Topics  . Smoking status: Never Smoker   . Smokeless tobacco: Never Used  . Alcohol Use: Yes     Social   Family History  Problem Relation Age of Onset  . Evelene Croon Parkinson White syndrome Mother    . Hypothyroidism Mother   . Basal cell carcinoma Mother   . Cancer Mother     basal cell skin CA  . Diabetes Maternal Grandfather   . Cervical cancer      MGGM  . Cervical cancer Maternal Grandmother   . Cancer Maternal Grandmother     cervical CA  . Coronary artery disease Paternal Grandmother   . Heart disease Paternal Grandmother     CAD  . Other Sister     Dermoid cyst  . Cancer Other     cervical CA   No Known Allergies Current Outpatient Prescriptions on File Prior to Visit  Medication Sig Dispense Refill  . levothyroxine (SYNTHROID, LEVOTHROID) 25 MCG tablet Take 12.5 mcg by mouth daily.           Review of Systems Review of Systems  Constitutional: Negative for fever, appetite change,  and unexpected weight change. pos for fatigue Eyes: Negative for pain and visual disturbance.  Respiratory: Negative for cough and shortness of breath.   Cardiovascular: Negative for cp or palpitations    Gastrointestinal: Negative for nausea, diarrhea and constipation.  Genitourinary: Negative for urgency and pos for frequency, neg for  dysuria . neg for breast soreness Skin: Negative for pallor or rash   Neurological: Negative for weakness, light-headedness, numbness and headaches.  Hematological: Negative for adenopathy. Does not bruise/bleed easily.  Psychiatric/Behavioral: Negative for dysphoric mood. The patient is not nervous/anxious.         Objective:   Physical Exam  Constitutional: She appears well-developed and well-nourished. No distress.  HENT:  Head: Normocephalic and atraumatic.  Mouth/Throat: Oropharynx is clear and moist.  Eyes: Conjunctivae and EOM are normal. Pupils are equal, round, and reactive to light. No scleral icterus.  Neck: Normal range of motion. Neck supple. No JVD present. Carotid bruit is not present. No thyromegaly present.  Abdominal: She exhibits no abdominal bruit and no mass. There is no hepatosplenomegaly. There is no tenderness.       No  suprapubic tenderness or fullness    Musculoskeletal: Normal range of motion. She exhibits no edema.  Lymphadenopathy:    She has no cervical adenopathy.  Neurological: She is alert. She has normal reflexes. No cranial nerve deficit. She exhibits normal muscle tone. Coordination normal.  Skin: Skin is warm and dry. No rash noted. No erythema. No pallor.  Psychiatric: She has a normal mood and affect.          Assessment & Plan:

## 2012-06-09 NOTE — Patient Instructions (Addendum)
Eat regularly  Drink lots of fluids See how you tolerate exercise No alcohol or otc meds  We will do ref for obgyn at check out  Start prenatal vitamin one daily with food

## 2012-06-26 ENCOUNTER — Telehealth: Payer: Self-pay | Admitting: Family Medicine

## 2012-06-26 MED ORDER — ONDANSETRON HCL 8 MG PO TABS: 8.0000 mg | ORAL_TABLET | Freq: Three times a day (TID) | ORAL | Status: DC | PRN

## 2012-06-26 NOTE — Telephone Encounter (Signed)
Pt needs zofran for first trimester nausea She called for refil from gyn and they had closed for the day I will sent px for zofran to her [facility] [place]

## 2012-07-02 LAB — OB RESULTS CONSOLE ABO/RH: RH Type: POSITIVE

## 2012-07-02 LAB — OB RESULTS CONSOLE RUBELLA ANTIBODY, IGM: Rubella: IMMUNE

## 2012-07-02 LAB — OB RESULTS CONSOLE GC/CHLAMYDIA: Chlamydia: NEGATIVE

## 2012-07-02 LAB — OB RESULTS CONSOLE HIV ANTIBODY (ROUTINE TESTING): HIV: NONREACTIVE

## 2012-07-02 LAB — OB RESULTS CONSOLE RPR: RPR: NONREACTIVE

## 2012-07-21 ENCOUNTER — Other Ambulatory Visit: Payer: Self-pay | Admitting: Obstetrics and Gynecology

## 2012-07-29 ENCOUNTER — Other Ambulatory Visit: Payer: Self-pay | Admitting: Family Medicine

## 2012-07-29 DIAGNOSIS — E039 Hypothyroidism, unspecified: Secondary | ICD-10-CM

## 2012-08-05 ENCOUNTER — Other Ambulatory Visit: Payer: 59

## 2012-08-22 ENCOUNTER — Inpatient Hospital Stay (HOSPITAL_COMMUNITY): Payer: 59

## 2012-08-22 ENCOUNTER — Encounter (HOSPITAL_COMMUNITY): Payer: Self-pay | Admitting: Emergency Medicine

## 2012-08-22 ENCOUNTER — Emergency Department (HOSPITAL_COMMUNITY)
Admission: EM | Admit: 2012-08-22 | Discharge: 2012-08-22 | Disposition: A | Discharge status: Nursing Home (Includes ICF) | Payer: 59 | Source: Home / Self Care | Attending: Emergency Medicine | Admitting: Emergency Medicine

## 2012-08-22 ENCOUNTER — Encounter (HOSPITAL_COMMUNITY): Payer: Self-pay | Admitting: *Deleted

## 2012-08-22 ENCOUNTER — Inpatient Hospital Stay (HOSPITAL_COMMUNITY)
Admission: AD | Admit: 2012-08-22 | Discharge: 2012-08-23 | Disposition: A | Discharge status: Home / Self Care | Payer: 59 | Source: Ambulatory Visit | Attending: Obstetrics and Gynecology | Admitting: Obstetrics and Gynecology

## 2012-08-22 DIAGNOSIS — N200 Calculus of kidney: Secondary | ICD-10-CM | POA: Insufficient documentation

## 2012-08-22 DIAGNOSIS — O2340 Unspecified infection of urinary tract in pregnancy, unspecified trimester: Secondary | ICD-10-CM

## 2012-08-22 DIAGNOSIS — O99891 Other specified diseases and conditions complicating pregnancy: Secondary | ICD-10-CM | POA: Insufficient documentation

## 2012-08-22 DIAGNOSIS — R3129 Other microscopic hematuria: Secondary | ICD-10-CM

## 2012-08-22 DIAGNOSIS — N949 Unspecified condition associated with female genital organs and menstrual cycle: Secondary | ICD-10-CM | POA: Insufficient documentation

## 2012-08-22 DIAGNOSIS — O26899 Other specified pregnancy related conditions, unspecified trimester: Secondary | ICD-10-CM

## 2012-08-22 DIAGNOSIS — N390 Urinary tract infection, site not specified: Secondary | ICD-10-CM | POA: Insufficient documentation

## 2012-08-22 DIAGNOSIS — O9989 Other specified diseases and conditions complicating pregnancy, childbirth and the puerperium: Secondary | ICD-10-CM

## 2012-08-22 DIAGNOSIS — O26839 Pregnancy related renal disease, unspecified trimester: Secondary | ICD-10-CM

## 2012-08-22 DIAGNOSIS — O2390 Unspecified genitourinary tract infection in pregnancy, unspecified trimester: Secondary | ICD-10-CM | POA: Insufficient documentation

## 2012-08-22 LAB — POCT URINALYSIS DIP (DEVICE)
Bilirubin Urine: NEGATIVE
Ketones, ur: NEGATIVE mg/dL
Nitrite: NEGATIVE
pH: 6 (ref 5.0–8.0)

## 2012-08-22 LAB — COMPREHENSIVE METABOLIC PANEL
Albumin: 3.6 g/dL (ref 3.5–5.2)
BUN: 9 mg/dL (ref 6–23)
Creatinine, Ser: 0.68 mg/dL (ref 0.50–1.10)
Total Bilirubin: 0.2 mg/dL — ABNORMAL LOW (ref 0.3–1.2)
Total Protein: 6.8 g/dL (ref 6.0–8.3)

## 2012-08-22 LAB — CBC WITH DIFFERENTIAL/PLATELET
Basophils Relative: 0 % (ref 0–1)
Eosinophils Absolute: 0.1 10*3/uL (ref 0.0–0.7)
HCT: 39.2 % (ref 36.0–46.0)
Hemoglobin: 14 g/dL (ref 12.0–15.0)
MCH: 31.4 pg (ref 26.0–34.0)
MCHC: 35.7 g/dL (ref 30.0–36.0)
Monocytes Absolute: 1.2 10*3/uL — ABNORMAL HIGH (ref 0.1–1.0)
Monocytes Relative: 9 % (ref 3–12)

## 2012-08-22 MED ORDER — TAMSULOSIN HCL 0.4 MG PO CAPS: 0.4000 mg | ORAL_CAPSULE | Freq: Once | ORAL | Status: DC

## 2012-08-22 MED ORDER — TAMSULOSIN HCL 0.4 MG PO CAPS
0.4000 mg | ORAL_CAPSULE | Freq: Once | ORAL | Status: AC
  Administered 2012-08-22: 0.4 mg via ORAL
  Filled 2012-08-22: qty 1

## 2012-08-22 MED ORDER — OXYCODONE-ACETAMINOPHEN 5-325 MG PO TABS
2.0000 | ORAL_TABLET | Freq: Once | ORAL | Status: AC
  Administered 2012-08-22: 2 via ORAL
  Filled 2012-08-22: qty 2

## 2012-08-22 MED ORDER — KETOROLAC TROMETHAMINE 30 MG/ML IJ SOLN
30.0000 mg | Freq: Once | INTRAMUSCULAR | Status: AC
  Administered 2012-08-22: 30 mg via INTRAVENOUS
  Filled 2012-08-22: qty 1

## 2012-08-22 MED ORDER — CEPHALEXIN 500 MG PO CAPS: 500.0000 mg | ORAL_CAPSULE | Freq: Four times a day (QID) | ORAL | Status: DC

## 2012-08-22 MED ORDER — CEPHALEXIN 500 MG PO CAPS
500.0000 mg | ORAL_CAPSULE | Freq: Once | ORAL | Status: AC
  Administered 2012-08-22: 500 mg via ORAL
  Filled 2012-08-22: qty 1

## 2012-08-22 MED ORDER — OXYCODONE-ACETAMINOPHEN 5-325 MG PO TABS: 1.0000 | ORAL_TABLET | ORAL | Status: DC | PRN

## 2012-08-22 MED ORDER — ONDANSETRON HCL 4 MG/2ML IJ SOLN
4.0000 mg | Freq: Once | INTRAMUSCULAR | Status: AC
  Administered 2012-08-22: 4 mg via INTRAVENOUS
  Filled 2012-08-22: qty 2

## 2012-08-22 MED ORDER — SODIUM CHLORIDE 0.9 % IV BOLUS (SEPSIS)
1000.0000 mL | Freq: Once | INTRAVENOUS | Status: AC
  Administered 2012-08-22: 1000 mL via INTRAVENOUS

## 2012-08-22 MED ORDER — HYDROMORPHONE HCL PF 1 MG/ML IJ SOLN
1.0000 mg | Freq: Once | INTRAMUSCULAR | Status: AC
  Administered 2012-08-22: 1 mg via INTRAVENOUS
  Filled 2012-08-22: qty 1

## 2012-08-22 NOTE — MAU Note (Signed)
Pain and urgency started around 1300.  Went to Urgent care, they sent her here for further eval.  Pt has hx of kidney stone, feels like that again.

## 2012-08-22 NOTE — MAU Provider Note (Signed)
Chief Complaint: Nephrolithiasis   First Provider Initiated Contact with Patient 08/22/12 1930     SUBJECTIVE HPI: Victoria Russell is a 26 y.o. G1P0 at 25.3 by LMP who was sent to MAU from Urgent Care for suspected Kidney Stone. Large Hgb on UA at Urgent Care. Reports pelvic pain and pressure similar to kidney stones in past, frequency and pain at urethral meatus. Denies fever, chills, dysuria, VB, vaginal discharge, nausea or vomiting. Rates pain 11/10 on pain scale. History of kidney stones x2 followed by Dr. Retta Diones with [facility].  Past Medical History  Diagnosis Date  . ADHD (attention deficit hyperactivity disorder)   . Hypothyroidism   . Hyperhidrosis   . WPW (Wolff-Parkinson-White syndrome)   . Atypical moles     dysplasia  . History of kidney stones   . Right ureteral stone 3/09  . Dysmenorrhea   . HLD (hyperlipidemia)    OB History    Grav Para Term Preterm Abortions TAB SAB Ect Mult Living   1              # Outc Date GA Lbr Len/2nd Wgt Sex Del Anes PTL Lv   1 CUR              Past Surgical History  Procedure Date  . Tonsillectomy and adenoidectomy   . Myringotomy   . Mole removal     dysplastic  . Cystoscopy    History   Social History  . Marital Status: Married    Spouse Name: N/A    Number of Children: 0  . Years of Education: N/A   Occupational History  . 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 Smoking status: Never Smoker   . Smokeless tobacco: Never Used  . Alcohol Use: Yes     Comment: Social  . Drug Use: No  . Sexually Active: No   Other Topics Concern  . Not on file   Social History Narrative   School for [employer]   No current facility-administered medications on file prior to encounter.   Current Outpatient Prescriptions on File Prior to Encounter  Medication Sig Dispense Refill  . levothyroxine (SYNTHROID, LEVOTHROID) 25 MCG tablet Take 12.5 mcg by mouth daily.       No Known Allergies  ROS:  Pertinent items in HPI  OBJECTIVE Blood pressure 134/76, pulse 78, temperature 97.9 F (36.6 C), temperature source Oral, resp. rate 22, height 5\' 3"  (1.6 m), weight 154 lb (69.854 kg), last menstrual period 05/06/2012. GENERAL: Well-developed, well-nourished female in moderate distress.  HEENT: Normocephalic HEART: normal rate RESP: normal effort ABDOMEN: Soft, non-tender, positive left CVA tenderness EXTREMITIES: Nontender, no edema NEURO: Alert and oriented SPECULUM EXAM: Deferred BIMANUAL: cervix long and closed; uterus 16 week size, nontender, no adnexal tenderness or masses FHR: 150 per Doppler  LAB RESULTS Results for orders placed during the hospital encounter of 08/22/12 (from the past 24 hour(s))  CBC WITH DIFFERENTIAL     Status: Abnormal   Collection Time   08/22/12  7:28 PM      Component Value Range   WBC 12.9 (*) 4.0 - 10.5 K/uL   RBC 4.46  3.87 - 5.11 MIL/uL   Hemoglobin 14.0  12.0 - 15.0 g/dL   HCT 40.9  81.1 - 91.4 %   MCV 87.9  78.0 - 100.0 fL   MCH 31.4  26.0 - 34.0 pg   MCHC 35.7  30.0 - 36.0 g/dL  RDW 12.3  11.5 - 15.5 %   Platelets 236  150 - 400 K/uL   Neutrophils Relative 64  43 - 77 %   Neutro Abs 8.3 (*) 1.7 - 7.7 K/uL   Lymphocytes Relative 25  12 - 46 %   Lymphs Abs 3.3  0.7 - 4.0 K/uL   Monocytes Relative 9  3 - 12 %   Monocytes Absolute 1.2 (*) 0.1 - 1.0 K/uL   Eosinophils Relative 1  0 - 5 %   Eosinophils Absolute 0.1  0.0 - 0.7 K/uL   Basophils Relative 0  0 - 1 %   Basophils Absolute 0.0  0.0 - 0.1 K/uL  COMPREHENSIVE METABOLIC PANEL     Status: Abnormal   Collection Time   08/22/12  7:28 PM      Component Value Range   Sodium 139  135 - 145 mEq/L   Potassium 3.3 (*) 3.5 - 5.1 mEq/L   Chloride 103  96 - 112 mEq/L   CO2 22  19 - 32 mEq/L   Glucose, Bld 85  70 - 99 mg/dL   BUN 9  6 - 23 mg/dL   Creatinine, Ser 4.09  0.50 - 1.10 mg/dL   Calcium 9.4  8.4 - 81.1 mg/dL   Total Protein 6.8  6.0 - 8.3 g/dL   Albumin 3.6  3.5 - 5.2 g/dL     AST 18  0 - 37 U/L   ALT 17  0 - 35 U/L   Alkaline Phosphatase 53  39 - 117 U/L   Total Bilirubin 0.2 (*) 0.3 - 1.2 mg/dL   GFR calc non Af Amer >90  >90 mL/min   GFR calc Af Amer >90  >90 mL/min    IMAGING US Renal  08/22/2012  *RADIOLOGY REPORT*  Clinical Data: [redacted] weeks pregnant.  Left flank pain.  History kidney stones.  RENAL/URINARY TRACT ULTRASOUND COMPLETE  Comparison:  Abdominal CT 12/11/2007.  Findings:  Right Kidney:  10.1 cm. No hydronephrosis or renal mass.  Left Kidney:  10.8 cm.  Mild left-sided hydronephrosis.  Causes not identified on present exam.  Bladder:  No ureteral jet is seen on either side.  The patient voided prior to imaging.  IMPRESSION: Left side hydronephrosis may reflects stone which is not confirmed on the present exam.  No ureteral jet is seen on either side. The patient voided prior to the exam.   Original Report Authenticated By: Lacy Duverney, M.D.    MAU COURSE Mild relief of pain with Toradol. Pain relieved with 1 mg of Dilaudid. First doses of Keflex, Flomax and Percocet given per consult with Dr. Renaldo Fiddler.  ASSESSMENT 1. Kidney stone complicating pregnancy   2. UTI (urinary tract infection) during pregnancy    PLAN Discharge home per consult with Dr. Renaldo Fiddler. Increase fluids. Urine culture pending Follow-up Information    Follow up with ADKINS,GRETCHEN, MD. On 08/24/2012.   Contact information:   [address] [place], [address] [place] [address] [phone]       Follow up with [facility]ERN[facility] ADMISS[facility]. (As needed if symptoms worsen)    Contact information:   [address] [id number] mc [place] [address] [phone]      Follow up with Chelsea Aus, MD.   Contact information:   [address] [place] [address] [phone]           Medication List     As of 08/22/2012 11:55  PM    TAKE these medications         cephALEXin 500 MG capsule    Commonly known as: KEFLEX   Take 1 capsule (500 mg total) by mouth 4 (four) times daily.      levothyroxine 25 MCG tablet   Commonly known as: SYNTHROID, LEVOTHROID   Take 12.5 mcg by mouth daily.      oxyCODONE-acetaminophen 5-325 MG per tablet   Commonly known as: PERCOCET/ROXICET   Take 1-2 tablets by mouth every 4 (four) hours as needed for pain.      Tamsulosin HCl 0.4 MG Caps   Commonly known as: FLOMAX   Take 1 capsule (0.4 mg total) by mouth once.         [place], CNM 08/22/2012  8:23 PM

## 2012-08-22 NOTE — ED Notes (Signed)
Pt c/o poss UTI since today in the am... Sx include: pelvic pain/pressure, "feels like her uterus is burning",... Denies: fevers, vomiting, diarrhea, hematuria, burning when she urinates... Hx of kidney stones... [redacted] weeks pregnant... Pt is alert w/no signs of distress.

## 2012-08-22 NOTE — MAU Note (Signed)
"  I started hurting about 1300 or 1400.  I went to the urgent care and waited about 2 hours.  They told me I had blood in my urine and then we came straight here."

## 2012-08-22 NOTE — ED Notes (Signed)
Report given to [employer], Consulting civil engineer at [employer]... Pt's husband will drive her there for further eval.

## 2012-08-22 NOTE — ED Provider Notes (Signed)
Medical screening examination/treatment/procedure(s) were performed by non-physician practitioner and as supervising physician I was immediately available for consultation/collaboration.  Leslee Home, M.D.   Reuben Likes, MD 08/22/12 [phone]

## 2012-08-22 NOTE — ED Provider Notes (Signed)
History     CSN: [id number]  Arrival date & time 08/22/12  1628   First MD Initiated Contact with Patient 08/22/12 1727      Chief Complaint  Patient presents with  . Urinary Tract Infection    (Consider location/radiation/quality/duration/timing/severity/associated sxs/prior treatment) HPI Comments: 26 year old female presents with lower pelvic pain and pressure today. He states she feels like her uterus is burning and there is discomfort at the meatus. She denies vaginal bleeding or abdominal pain. Denies nausea or vomiting. She has microscopic hematuria and states this feels like it might be a kidney stone.   Past Medical History  Diagnosis Date  . ADHD (attention deficit hyperactivity disorder)   . Hypothyroidism   . Hyperhidrosis   . WPW (Wolff-Parkinson-White syndrome)   . Atypical moles     dysplasia  . History of kidney stones   . Right ureteral stone 3/09  . Dysmenorrhea   . HLD (hyperlipidemia)     Past Surgical History  Procedure Date  . Tonsillectomy and adenoidectomy   . Myringotomy   . Mole removal     dysplastic    Family History  Problem Relation Age of Onset  . Evelene Croon Parkinson White syndrome Mother   . Hypothyroidism Mother   . Basal cell carcinoma Mother   . Cancer Mother     basal cell skin CA  . Diabetes Maternal Grandfather   . Cervical cancer      MGGM  . Cervical cancer Maternal Grandmother   . Cancer Maternal Grandmother     cervical CA  . Coronary artery disease Paternal Grandmother   . Heart disease Paternal Grandmother     CAD  . Other Sister     Dermoid cyst  . Cancer Other     cervical CA    History  Substance Use Topics  . Smoking status: Never Smoker   . Smokeless tobacco: Never Used  . Alcohol Use: Yes     Comment: Social    OB History    Grav Para Term Preterm Abortions TAB SAB Ect Mult Living   1               Review of Systems  Constitutional: Negative for fever, activity change and fatigue.  HENT:  Negative.   Cardiovascular: Negative.   Gastrointestinal: Negative for vomiting, abdominal pain and blood in stool.  Genitourinary: Positive for frequency, hematuria and pelvic pain. Negative for dysuria and vaginal bleeding.  Musculoskeletal: Negative.   Skin: Negative.     Allergies  Review of patient's allergies indicates no known allergies.  Home Medications   Current Outpatient Rx  Name  Route  Sig  Dispense  Refill  . LEVOTHYROXINE SODIUM 25 MCG PO TABS   Oral   Take 12.5 mcg by mouth daily.         Marland Kitchen ONDANSETRON HCL 8 MG PO TABS   Oral   Take 1 tablet (8 mg total) by mouth every 8 (eight) hours as needed for nausea.   30 tablet   0     BP 133/85  Pulse 80  Temp 98.6 F (37 C) (Oral)  Resp 17  SpO2 100%  LMP 05/06/2012  Physical Exam  Nursing note and vitals reviewed. Constitutional: She is oriented to person, place, and time. She appears well-developed and well-nourished.  Neck: Neck supple.  Cardiovascular: Normal rate and normal heart sounds.   Pulmonary/Chest: Effort normal.  Abdominal: Soft. There is no tenderness. There is no rebound and no  guarding.       Tender over the lower mid pelvis. Abdomen protuberant consistent with pregnancy.  Neurological: She is alert and oriented to person, place, and time.  Skin: Skin is warm and dry.  Psychiatric: She has a normal mood and affect.    ED Course  Procedures (including critical care time)  Labs Reviewed  POCT URINALYSIS DIP (DEVICE) - Abnormal; Notable for the following:    Hgb urine dipstick LARGE (*)     All other components within normal limits   No results found.   1. Pelvic pain in pregnancy, antepartum   2. Hematuria, microscopic       MDM  This is a 26 year old female [redacted] weeks gestation with mid pelvic pain. She came with the expectations of having a UTI, but her urine is clear with exception of microscopic hematuria. She denies dysuria but has frequency which is also a normal  complaint during pregnancy; she also has discomfort in the meatus. This may or may not be a kidney stone however I believe the optimal care would be to send her to women's health clinic to have an ultrasound to rule out other problems and possibly diagnosis a calculusl that maybe at the UVJ based on the location of her pain. Our only other option is to presume a kidney stone and treat with analgesics. I believe further investigation would be a better option than this. [facility] and spoke with the nurse Jolynn at triage to give a report.         Hayden Rasmussen, NP 08/22/12 [phone]

## 2012-08-23 ENCOUNTER — Inpatient Hospital Stay (HOSPITAL_COMMUNITY)
Admission: AD | Admit: 2012-08-23 | Discharge: 2012-08-24 | DRG: 781 | Disposition: A | Discharge status: Home / Self Care | Payer: 59 | Source: Ambulatory Visit | Attending: Obstetrics and Gynecology | Admitting: Obstetrics and Gynecology

## 2012-08-23 ENCOUNTER — Encounter (HOSPITAL_COMMUNITY): Payer: Self-pay | Admitting: *Deleted

## 2012-08-23 DIAGNOSIS — N200 Calculus of kidney: Secondary | ICD-10-CM | POA: Diagnosis present

## 2012-08-23 DIAGNOSIS — N949 Unspecified condition associated with female genital organs and menstrual cycle: Secondary | ICD-10-CM | POA: Diagnosis present

## 2012-08-23 DIAGNOSIS — O26839 Pregnancy related renal disease, unspecified trimester: Principal | ICD-10-CM | POA: Diagnosis present

## 2012-08-23 MED ORDER — LEVOTHYROXINE SODIUM 25 MCG PO TABS
12.5000 ug | ORAL_TABLET | Freq: Every day | ORAL | Status: DC
  Administered 2012-08-23 – 2012-08-24 (×2): 12.5 ug via ORAL
  Filled 2012-08-23 (×3): qty 0.5

## 2012-08-23 MED ORDER — ONDANSETRON 8 MG/NS 50 ML IVPB
8.0000 mg | Freq: Four times a day (QID) | INTRAVENOUS | Status: DC | PRN
  Filled 2012-08-23: qty 8

## 2012-08-23 MED ORDER — HYDROMORPHONE 0.3 MG/ML IV SOLN
INTRAVENOUS | Status: DC
  Administered 2012-08-23: 0.5999 mg via INTRAVENOUS
  Administered 2012-08-23: 2.7 mg via INTRAVENOUS
  Administered 2012-08-23: 15:00:00 via INTRAVENOUS
  Administered 2012-08-23: 2.79 mg via INTRAVENOUS
  Administered 2012-08-23: 0.2 mg via INTRAVENOUS
  Administered 2012-08-23: 03:00:00 via INTRAVENOUS
  Administered 2012-08-24 (×2): 2 mg via INTRAVENOUS
  Administered 2012-08-24: 1.19 mg via INTRAVENOUS
  Filled 2012-08-23 (×2): qty 25

## 2012-08-23 MED ORDER — PROMETHAZINE HCL 25 MG/ML IJ SOLN
12.5000 mg | Freq: Four times a day (QID) | INTRAMUSCULAR | Status: DC | PRN
  Administered 2012-08-23 – 2012-08-24 (×2): 12.5 mg via INTRAVENOUS
  Filled 2012-08-23 (×2): qty 1

## 2012-08-23 MED ORDER — CALCIUM CARBONATE ANTACID 500 MG PO CHEW: 2.0000 | CHEWABLE_TABLET | ORAL | Status: DC | PRN

## 2012-08-23 MED ORDER — ONDANSETRON HCL 4 MG/2ML IJ SOLN
4.0000 mg | Freq: Four times a day (QID) | INTRAMUSCULAR | Status: DC | PRN
  Administered 2012-08-23: 4 mg via INTRAVENOUS
  Filled 2012-08-23: qty 2

## 2012-08-23 MED ORDER — DOCUSATE SODIUM 100 MG PO CAPS
100.0000 mg | ORAL_CAPSULE | Freq: Every day | ORAL | Status: DC
  Administered 2012-08-23 – 2012-08-24 (×2): 100 mg via ORAL
  Filled 2012-08-23 (×2): qty 1

## 2012-08-23 MED ORDER — DIPHENHYDRAMINE HCL 50 MG/ML IJ SOLN: 12.5000 mg | Freq: Four times a day (QID) | INTRAMUSCULAR | Status: DC | PRN

## 2012-08-23 MED ORDER — SODIUM CHLORIDE 0.9 % IJ SOLN: 9.0000 mL | INTRAMUSCULAR | Status: DC | PRN

## 2012-08-23 MED ORDER — LACTATED RINGERS IV SOLN
INTRAVENOUS | Status: DC
  Administered 2012-08-23 – 2012-08-24 (×6): via INTRAVENOUS

## 2012-08-23 MED ORDER — ZOLPIDEM TARTRATE 5 MG PO TABS: 5.0000 mg | ORAL_TABLET | Freq: Every evening | ORAL | Status: DC | PRN

## 2012-08-23 MED ORDER — PRENATAL MULTIVITAMIN CH
1.0000 | ORAL_TABLET | Freq: Every day | ORAL | Status: DC
  Administered 2012-08-23 – 2012-08-24 (×2): 1 via ORAL
  Filled 2012-08-23 (×2): qty 1

## 2012-08-23 MED ORDER — TAMSULOSIN HCL 0.4 MG PO CAPS
0.4000 mg | ORAL_CAPSULE | Freq: Every day | ORAL | Status: DC
  Administered 2012-08-23 – 2012-08-24 (×2): 0.4 mg via ORAL
  Filled 2012-08-23 (×3): qty 1

## 2012-08-23 MED ORDER — NALOXONE HCL 0.4 MG/ML IJ SOLN: 0.4000 mg | INTRAMUSCULAR | Status: DC | PRN

## 2012-08-23 MED ORDER — DIPHENHYDRAMINE HCL 12.5 MG/5ML PO ELIX: 12.5000 mg | ORAL_SOLUTION | Freq: Four times a day (QID) | ORAL | Status: DC | PRN

## 2012-08-23 MED ORDER — HYDROMORPHONE 0.3 MG/ML IV SOLN
Freq: Once | INTRAVENOUS | Status: DC
  Administered 2012-08-23: 0.2 mg via INTRAVENOUS

## 2012-08-23 NOTE — H&P (Signed)
Chief Complaint: Nephrolithiasis   First Provider Initiated Contact with Patient 08/22/12 1930  SUBJECTIVE  HPI: Victoria Russell is a 26 y.o. G1P0 at 59.3 by LMP who was sent to MAU from Urgent Care for suspected Kidney Stone. Large Hgb on UA at Urgent Care. Reports pelvic pain and pressure similar to kidney stones in past, frequency and pain at urethral meatus. Denies fever, chills, dysuria, VB, vaginal discharge, nausea or vomiting. Rates pain 11/10 on pain scale. History of kidney stones x2 followed by Dr. Retta Diones with [facility].  Past Medical History   Diagnosis  Date   .  ADHD (attention deficit hyperactivity disorder)    .  Hypothyroidism    .  Hyperhidrosis    .  WPW (Wolff-Parkinson-White syndrome)    .  Atypical moles      dysplasia   .  History of kidney stones    .  Right ureteral stone  3/09   .  Dysmenorrhea    .  HLD (hyperlipidemia)     OB History    Grav  Para  Term  Preterm  Abortions  TAB  SAB  Ect  Mult  Living    1               #  Outc  Date  GA  Lbr Len/2nd  Wgt  Sex  Del  Anes  PTL  Lv    1  CUR               Past Surgical History   Procedure  Date   .  Tonsillectomy and adenoidectomy    .  Myringotomy    .  Mole removal      dysplastic   .  Cystoscopy     History    Social History   .  Marital Status:  Married     Spouse Name:  N/A     Number of Children:  0   .  Years of Education:  N/A    Occupational History   .  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  Smoking status:  Never Smoker   .  Smokeless tobacco:  Never Used   .  Alcohol Use:  Yes      Comment: Social   .  Drug Use:  No   .  Sexually Active:  No    Other Topics  Concern   .  Not on file    Social History Narrative    School for [employer]    No current facility-administered medications on file prior to encounter.    Current Outpatient Prescriptions on File Prior to Encounter   Medication  Sig  Dispense  Refill   .  levothyroxine  (SYNTHROID, LEVOTHROID) 25 MCG tablet  Take 12.5 mcg by mouth daily.      No Known Allergies  ROS: Pertinent items in HPI  OBJECTIVE  Blood pressure 134/76, pulse 78, temperature 97.9 F (36.6 C), temperature source Oral, resp. rate 22, height 5\' 3"  (1.6 m), weight 154 lb (69.854 kg), last menstrual period 05/06/2012.  GENERAL: Well-developed, well-nourished female in moderate distress.  HEENT: Normocephalic  HEART: normal rate  RESP: normal effort  ABDOMEN: Soft, non-tender, positive left CVA tenderness  EXTREMITIES: Nontender, no edema  NEURO: Alert and oriented  SPECULUM EXAM: Deferred  BIMANUAL: cervix long and closed; uterus 16 week size, nontender, no adnexal tenderness or masses  FHR: 150  per Doppler  LAB RESULTS  Results for orders placed during the hospital encounter of 08/22/12 (from the past 24 hour(s))   CBC WITH DIFFERENTIAL Status: Abnormal    Collection Time    08/22/12 7:28 PM   Component  Value  Range    WBC  12.9 (*)  4.0 - 10.5 K/uL    RBC  4.46  3.87 - 5.11 MIL/uL    Hemoglobin  14.0  12.0 - 15.0 g/dL    HCT  82.9  56.2 - 13.0 %    MCV  87.9  78.0 - 100.0 fL    MCH  31.4  26.0 - 34.0 pg    MCHC  35.7  30.0 - 36.0 g/dL    RDW  86.5  78.4 - 69.6 %    Platelets  236  150 - 400 K/uL    Neutrophils Relative  64  43 - 77 %    Neutro Abs  8.3 (*)  1.7 - 7.7 K/uL    Lymphocytes Relative  25  12 - 46 %    Lymphs Abs  3.3  0.7 - 4.0 K/uL    Monocytes Relative  9  3 - 12 %    Monocytes Absolute  1.2 (*)  0.1 - 1.0 K/uL    Eosinophils Relative  1  0 - 5 %    Eosinophils Absolute  0.1  0.0 - 0.7 K/uL    Basophils Relative  0  0 - 1 %    Basophils Absolute  0.0  0.0 - 0.1 K/uL   COMPREHENSIVE METABOLIC PANEL Status: Abnormal    Collection Time    08/22/12 7:28 PM   Component  Value  Range    Sodium  139  135 - 145 mEq/L    Potassium  3.3 (*)  3.5 - 5.1 mEq/L    Chloride  103  96 - 112 mEq/L    CO2  22  19 - 32 mEq/L    Glucose, Bld  85  70 - 99 mg/dL    BUN  9   6 - 23 mg/dL    Creatinine, Ser  2.95  0.50 - 1.10 mg/dL    Calcium  9.4  8.4 - 10.5 mg/dL    Total Protein  6.8  6.0 - 8.3 g/dL    Albumin  3.6  3.5 - 5.2 g/dL    AST  18  0 - 37 U/L    ALT  17  0 - 35 U/L    Alkaline Phosphatase  53  39 - 117 U/L    Total Bilirubin  0.2 (*)  0.3 - 1.2 mg/dL    GFR calc non Af Amer  >90  >90 mL/min    GFR calc Af Amer  >90  >90 mL/min    IMAGING  US Renal  08/22/2012 *RADIOLOGY REPORT* Clinical Data: [redacted] weeks pregnant. Left flank pain. History kidney stones. RENAL/URINARY TRACT ULTRASOUND COMPLETE Comparison: Abdominal CT 12/11/2007. Findings: Right Kidney: 10.1 cm. No hydronephrosis or renal mass. Left Kidney: 10.8 cm. Mild left-sided hydronephrosis. Causes not identified on present exam. Bladder: No ureteral jet is seen on either side. The patient voided prior to imaging. IMPRESSION: Left side hydronephrosis may reflects stone which is not confirmed on the present exam. No ureteral jet is seen on either side. The patient voided prior to the exam. Original Report Authenticated By: Lacy Duverney, M.D.  MAU COURSE  Mild relief of pain with Toradol. Pain relieved with 1 mg  of Dilaudid.  First doses of Keflex, Flomax and Percocet given per consult with Dr. Renaldo Fiddler.  ASSESSMENT  1.  Kidney stone complicating pregnancy  IVF IV antiemetics IV pain control

## 2012-08-23 NOTE — Progress Notes (Signed)
Pt reports good pain control with IV PCA.  + nausea a/w pain and pain meds.  No f/c.  No VB or pelvic cramping.  Pain on left.  AF, VSS  + FHT Gen - NAD currently Abd - soft, NT/ND No CVA tenderness currently (PCA controlled) Ext - no edema PV - deferred  A/P:  15 weeks with left nephrolithiasis Continue IV PCA IV antiemetics prn Add flomax IVF

## 2012-08-24 ENCOUNTER — Encounter (HOSPITAL_COMMUNITY): Payer: Self-pay | Admitting: *Deleted

## 2012-08-24 LAB — CBC
MCHC: 34 g/dL (ref 30.0–36.0)
MCV: 90.4 fL (ref 78.0–100.0)
Platelets: 167 10*3/uL (ref 150–400)
RDW: 12.6 % (ref 11.5–15.5)
WBC: 7.1 10*3/uL (ref 4.0–10.5)

## 2012-08-24 MED ORDER — PROMETHAZINE HCL 25 MG PO TABS
12.5000 mg | ORAL_TABLET | Freq: Four times a day (QID) | ORAL | Status: DC | PRN
  Administered 2012-08-24: 12.5 mg via ORAL
  Filled 2012-08-24: qty 1

## 2012-08-24 MED ORDER — HYDROMORPHONE HCL 2 MG PO TABS: 2.0000 mg | ORAL_TABLET | ORAL | Status: DC | PRN

## 2012-08-24 MED ORDER — ONDANSETRON 4 MG PO TBDP
4.0000 mg | ORAL_TABLET | Freq: Three times a day (TID) | ORAL | Status: DC | PRN
  Administered 2012-08-24: 4 mg via ORAL
  Filled 2012-08-24: qty 1

## 2012-08-24 MED ORDER — ACETAMINOPHEN 500 MG PO TABS
1000.0000 mg | ORAL_TABLET | Freq: Once | ORAL | Status: AC
  Administered 2012-08-24: 1000 mg via ORAL
  Filled 2012-08-24: qty 2

## 2012-08-24 MED ORDER — PROMETHAZINE HCL 12.5 MG PO TABS: 12.5000 mg | ORAL_TABLET | Freq: Four times a day (QID) | ORAL | Status: DC | PRN

## 2012-08-24 MED ORDER — HYDROMORPHONE HCL 2 MG PO TABS
2.0000 mg | ORAL_TABLET | ORAL | Status: DC | PRN
  Administered 2012-08-24: 2 mg via ORAL
  Filled 2012-08-24: qty 1

## 2012-08-24 NOTE — Progress Notes (Signed)
Pt discharged to home with husband.  Condition stable.  Pt ambulated to car with L. Isidore Moos, NT.  Pt home with urinary collection "hat", graduate cylinder, and urine strainer.  Pt explained use of all three to RN and states that she has strained her urine before.  No other equipment ordered for home at discharge.

## 2012-08-24 NOTE — Progress Notes (Signed)
Subjective: Patient reports nausea but no vomiting this AM.  She reports adequate pain control on Dilaudid PCA and Flomax.  She had back pain earlier today similar to when she is about to pass a stone but no passage yet.     Objective: I have reviewed patient's vital signs, medications and labs.  General: alert, cooperative and appears stated age GI: soft, non-tender; bowel sounds normal; no masses,  no organomegaly Extremities: extremities normal, atraumatic, no cyanosis or edema and Homans sign is negative, no sign of DVT No CVA tenderness  Assessment/Plan: 26yo G1 at [redacted]w[redacted]d with nephrolithiasis -IVF; strain urine -Improved WBC count; unlikely pyelo picture -Transition to po meds and likely d/c this pm   LOS: 1 day    Daphene Chisholm 08/24/2012, 9:58 AM

## 2012-08-24 NOTE — Plan of Care (Signed)
Problem: Phase II Progression Outcomes Goal: Vital signs remain stable Outcome: Completed/Met Date Met:  08/24/12 VSS at this time Goal: Other Phase II Outcomes/Goals Outcome: Completed/Met Date Met:  08/24/12 Pain under control with PCA Dilaudid

## 2012-08-24 NOTE — Discharge Summary (Signed)
Physician Discharge Summary  Patient ID: Victoria Russell MRN: [id number] DOB/AGE: 01-Aug-1986 26 y.o.  Admit date: 08/23/2012 Discharge date: 08/24/2012  Admission Diagnoses:  26yo G1 at 15w with nephrolithiasis  Discharge Diagnoses: SAA Active Problems:  * No active hospital problems. *    Discharged Condition: good  Hospital Course: Admitted for pain control and treated with Dilaudid PCA, IVF, Flomax.  Transitioned to po meds on HD#2 with good pain and nausea control.  Meeting all goals for discharge.    Consults: None  Significant Diagnostic Studies: labs: CBC and radiology: Ultrasound: left hydronephrosis  Treatments: IV hydration and analgesia: Dilaudid  Discharge Exam: Blood pressure 120/73, pulse 85, temperature 98.6 F (37 C), temperature source Oral, resp. rate 16, height 5\' 3"  (1.6 m), weight 159 lb 12 oz (72.462 kg), last menstrual period 05/06/2012, SpO2 98.00%. General appearance: alert, cooperative and appears stated age Back: symmetric, no curvature. ROM normal. No CVA tenderness., no CVA tenderness, +lumbosacral back pain GI: soft, non-tender; bowel sounds normal; no masses,  no organomegaly Extremities: extremities normal, atraumatic, no cyanosis or edema and Homans sign is negative, no sign of DVT  Disposition: 01-Home or Self Care     Medication List     As of 08/24/2012  5:42 PM    STOP taking these medications         cephALEXin 500 MG capsule   Commonly known as: KEFLEX      oxyCODONE-acetaminophen 5-325 MG per tablet   Commonly known as: PERCOCET/ROXICET      TAKE these medications         HYDROmorphone 2 MG tablet   Commonly known as: DILAUDID   Take 1 tablet (2 mg total) by mouth every 3 (three) hours as needed.      levothyroxine 25 MCG tablet   Commonly known as: SYNTHROID, LEVOTHROID   Take 12.5 mcg by mouth daily.      promethazine 12.5 MG tablet   Commonly known as: PHENERGAN   Take 1 tablet (12.5 mg total) by mouth every 6  (six) hours as needed.      Tamsulosin HCl 0.4 MG Caps   Commonly known as: FLOMAX   Take 1 capsule (0.4 mg total) by mouth once.         SignedMitchel Honour 08/24/2012, 5:42 PM

## 2012-08-24 NOTE — Plan of Care (Signed)
Problem: Phase I Progression Outcomes Goal: Initial discharge plan identified Outcome: Completed/Met Date Met:  08/24/12 Afebrile Pain control Understands f/u care Goal: Other Phase I Outcomes/Goals Outcome: Progressing Stone passed  Problem: Phase II Progression Outcomes Goal: Progress activity as tolerated unless otherwise ordered Outcome: Completed/Met Date Met:  08/24/12 Walks well and tolerates well but is attached to a lot of monitors

## 2012-10-17 ENCOUNTER — Other Ambulatory Visit: Payer: Self-pay | Admitting: Advanced Practice Midwife

## 2012-10-19 ENCOUNTER — Ambulatory Visit (INDEPENDENT_AMBULATORY_CARE_PROVIDER_SITE_OTHER): Payer: 59 | Admitting: *Deleted

## 2012-10-19 DIAGNOSIS — Z23 Encounter for immunization: Secondary | ICD-10-CM

## 2012-12-11 ENCOUNTER — Encounter (HOSPITAL_COMMUNITY): Payer: Self-pay | Admitting: *Deleted

## 2012-12-11 ENCOUNTER — Inpatient Hospital Stay (HOSPITAL_COMMUNITY)
Admission: AD | Admit: 2012-12-11 | Discharge: 2012-12-11 | Disposition: A | Discharge status: Home / Self Care | Payer: 59 | Source: Ambulatory Visit | Attending: Obstetrics and Gynecology | Admitting: Obstetrics and Gynecology

## 2012-12-11 DIAGNOSIS — O2390 Unspecified genitourinary tract infection in pregnancy, unspecified trimester: Secondary | ICD-10-CM | POA: Insufficient documentation

## 2012-12-11 DIAGNOSIS — R300 Dysuria: Secondary | ICD-10-CM | POA: Insufficient documentation

## 2012-12-11 DIAGNOSIS — N390 Urinary tract infection, site not specified: Secondary | ICD-10-CM | POA: Insufficient documentation

## 2012-12-11 LAB — URINALYSIS, ROUTINE W REFLEX MICROSCOPIC
Bilirubin Urine: NEGATIVE
Nitrite: NEGATIVE
Protein, ur: NEGATIVE mg/dL
Specific Gravity, Urine: 1.015 (ref 1.005–1.030)
Urobilinogen, UA: 0.2 mg/dL (ref 0.0–1.0)

## 2012-12-11 LAB — URINE MICROSCOPIC-ADD ON

## 2012-12-11 MED ORDER — PHENAZOPYRIDINE HCL 100 MG PO TABS: 100.0000 mg | ORAL_TABLET | Freq: Three times a day (TID) | ORAL | Status: DC | PRN

## 2012-12-11 MED ORDER — NITROFURANTOIN MONOHYD MACRO 100 MG PO CAPS: 100.0000 mg | ORAL_CAPSULE | Freq: Two times a day (BID) | ORAL | Status: DC

## 2012-12-11 NOTE — MAU Note (Signed)
Patient is in with c/o possible UTI, she states that she have blood in her urine and her physician told her to come to be evaluated

## 2012-12-11 NOTE — MAU Note (Signed)
Burning with urination. Saw blood in urine last night.

## 2012-12-11 NOTE — MAU Provider Note (Signed)
Chief Complaint:  Hematuria   First Provider Initiated Contact with Patient 12/11/12 0840      HPI: Victoria Russell is a 27 y.o. G1P0 at 25w2dwho presents to maternity admissions reporting dysuria since yesterday and report of seeing pink blood while urinating this morning.  She reports checking with a cotton swab and blood is coming from urethra and not vagina.  She reports good fetal movement, denies cramping/contractions or back pain, LOF, vaginal bleeding, vaginal itching/burning, h/a, dizziness, n/v, or fever/chills.     Past Medical History: Past Medical History  Diagnosis Date  . ADHD (attention deficit hyperactivity disorder)   . Hypothyroidism   . Hyperhidrosis   . WPW (Wolff-Parkinson-White syndrome)   . Atypical moles     dysplasia  . History of kidney stones   . Right ureteral stone 3/09  . Dysmenorrhea   . HLD (hyperlipidemia)     Past obstetric history: OB History   Grav Para Term Preterm Abortions TAB SAB Ect Mult Living   1         0     # Outc Date GA Lbr Len/2nd Wgt Sex Del Anes PTL Lv   1 CUR               Past Surgical History: Past Surgical History  Procedure Laterality Date  . Tonsillectomy and adenoidectomy    . Myringotomy    . Mole removal      dysplastic  . Cystoscopy      Family History: Family History  Problem Relation Age of Onset  . Evelene Croon Parkinson White syndrome Mother   . Hypothyroidism Mother   . Basal cell carcinoma Mother   . Cancer Mother     basal cell skin CA  . Diabetes Maternal Grandfather   . Cervical cancer      MGGM  . Cervical cancer Maternal Grandmother   . Cancer Maternal Grandmother     cervical CA  . Coronary artery disease Paternal Grandmother   . Heart disease Paternal Grandmother     CAD  . Other Sister     Dermoid cyst  . Cancer Other     cervical CA    Social History: History  Substance Use Topics  . Smoking status: Never Smoker   . Smokeless tobacco: Never Used  . Alcohol Use: Yes     Comment:  Social    Allergies: No Known Allergies  Meds:  Prescriptions prior to admission  Medication Sig Dispense Refill  . flintstones complete (FLINTSTONES) 60 MG chewable tablet Chew 2 tablets by mouth daily.      . folic acid (FOLVITE) 800 MCG tablet Take 800 mcg by mouth daily.      Marland Kitchen levothyroxine (SYNTHROID, LEVOTHROID) 25 MCG tablet Take 12.5 mcg by mouth daily.        ROS: Pertinent findings in history of present illness.  Physical Exam  Temperature 98.5 F (36.9 C), temperature source Oral, height 5\' 4"  (1.626 m), weight 83.235 kg (183 lb 8 oz), last menstrual period 05/06/2012. GENERAL: Well-developed, well-nourished female in no acute distress.  HEENT: normocephalic HEART: normal rate RESP: normal effort ABDOMEN: Soft, non-tender, gravid appropriate for gestational age BACK: negative CVA tenderness  EXTREMITIES: Nontender, no edema NEURO: alert and oriented     FHT:  Baseline 125 , moderate variability, accelerations present, no decelerations Contractions: None on monitor, none by palpation   Labs: U/A pending  Care assumed by Caren Griffins, CNM, @9  am    Medication List  ASK your doctor about these medications       flintstones complete 60 MG chewable tablet  Chew 2 tablets by mouth daily.     folic acid 800 MCG tablet  Commonly known as:  FOLVITE  Take 800 mcg by mouth daily.     levothyroxine 25 MCG tablet  Commonly known as:  SYNTHROID, LEVOTHROID  Take 12.5 mcg by mouth daily.        Sharen Counter Certified Nurse-Midwife 12/11/2012 8:43 AM  Results for orders placed during the hospital encounter of 12/11/12 (from the past 24 hour(s))  URINALYSIS, ROUTINE W REFLEX MICROSCOPIC     Status: Abnormal   Collection Time    12/11/12  8:30 AM      Result Value Range   Color, Urine YELLOW  YELLOW   APPearance CLEAR  CLEAR   Specific Gravity, Urine 1.015  1.005 - 1.030   pH 7.5  5.0 - 8.0   Glucose, UA 100 (*) NEGATIVE mg/dL   Hgb urine  dipstick SMALL (*) NEGATIVE   Bilirubin Urine NEGATIVE  NEGATIVE   Ketones, ur NEGATIVE  NEGATIVE mg/dL   Protein, ur NEGATIVE  NEGATIVE mg/dL   Urobilinogen, UA 0.2  0.0 - 1.0 mg/dL   Nitrite NEGATIVE  NEGATIVE   Leukocytes, UA TRACE (*) NEGATIVE  URINE MICROSCOPIC-ADD ON     Status: None   Collection Time    12/11/12  8:30 AM      Result Value Range   Squamous Epithelial / LPF RARE  RARE   WBC, UA 0-2  <3 WBC/hpf   RBC / HPF 3-6  <3 RBC/hpf   Bacteria, UA RARE  RARE   Urine-Other MUCOUS PRESENT    Hx renal stones followed by [facility] and admitted at 15 wk this pregnancy for same. No pain except with urination. Has pain meds at home.  FHR 145, reactive; no UCs D/W Dr. Rana Snare. While awaiting culture result, will treat with Macrobid and Pyridium. Follow up in office nxt week. Force fluids.    Medication List    TAKE these medications       flintstones complete 60 MG chewable tablet  Chew 2 tablets by mouth daily.     folic acid 800 MCG tablet  Commonly known as:  FOLVITE  Take 800 mcg by mouth daily.     levothyroxine 25 MCG tablet  Commonly known as:  SYNTHROID, LEVOTHROID  Take 12.5 mcg by mouth daily.     nitrofurantoin (macrocrystal-monohydrate) 100 MG capsule  Commonly known as:  MACROBID  Take 1 capsule (100 mg total) by mouth 2 (two) times daily.     phenazopyridine 100 MG tablet  Commonly known as:  PYRIDIUM  Take 1 tablet (100 mg total) by mouth 3 (three) times daily as needed for pain.       Follow-up Information   Follow up with LOWE,Savas Elvin C, MD. Schedule an appointment as soon as possible for a visit in 1 week.   Contact information:   [address], [address] [place] [address] [phone]

## 2012-12-12 LAB — URINE CULTURE
Colony Count: 35000
Special Requests: NORMAL

## 2013-01-25 ENCOUNTER — Encounter (HOSPITAL_COMMUNITY): Payer: Self-pay | Admitting: *Deleted

## 2013-01-25 ENCOUNTER — Inpatient Hospital Stay (HOSPITAL_COMMUNITY)
Admission: AD | Admit: 2013-01-25 | Discharge: 2013-01-25 | Disposition: A | Discharge status: Home / Self Care | Payer: 59 | Source: Ambulatory Visit | Attending: Obstetrics and Gynecology | Admitting: Obstetrics and Gynecology

## 2013-01-25 ENCOUNTER — Encounter: Payer: Self-pay | Admitting: *Deleted

## 2013-01-25 DIAGNOSIS — R030 Elevated blood-pressure reading, without diagnosis of hypertension: Secondary | ICD-10-CM | POA: Insufficient documentation

## 2013-01-25 DIAGNOSIS — O99891 Other specified diseases and conditions complicating pregnancy: Secondary | ICD-10-CM | POA: Insufficient documentation

## 2013-01-25 LAB — URINALYSIS, ROUTINE W REFLEX MICROSCOPIC
Bilirubin Urine: NEGATIVE
Glucose, UA: 100 mg/dL — AB
Ketones, ur: NEGATIVE mg/dL
Protein, ur: NEGATIVE mg/dL
Urobilinogen, UA: 0.2 mg/dL (ref 0.0–1.0)

## 2013-01-25 LAB — URINE MICROSCOPIC-ADD ON

## 2013-01-25 LAB — CBC
HCT: 34.3 % — ABNORMAL LOW (ref 36.0–46.0)
MCH: 27.3 pg (ref 26.0–34.0)
MCHC: 33.5 g/dL (ref 30.0–36.0)
MCV: 81.5 fL (ref 78.0–100.0)
Platelets: 261 10*3/uL (ref 150–400)
RDW: 13.3 % (ref 11.5–15.5)
WBC: 10.7 10*3/uL — ABNORMAL HIGH (ref 4.0–10.5)

## 2013-01-25 LAB — COMPREHENSIVE METABOLIC PANEL
AST: 14 U/L (ref 0–37)
Albumin: 2.6 g/dL — ABNORMAL LOW (ref 3.5–5.2)
BUN: 6 mg/dL (ref 6–23)
Calcium: 9.4 mg/dL (ref 8.4–10.5)
Chloride: 101 mEq/L (ref 96–112)
Creatinine, Ser: 0.56 mg/dL (ref 0.50–1.10)
Total Bilirubin: 0.2 mg/dL — ABNORMAL LOW (ref 0.3–1.2)

## 2013-01-25 LAB — URIC ACID: Uric Acid, Serum: 2.5 mg/dL (ref 2.4–7.0)

## 2013-01-25 NOTE — MAU Note (Signed)
Pt sent from Dr. Adalberto Ill office for [facility] evaluation.

## 2013-01-25 NOTE — MAU Provider Note (Signed)
History     CSN: [id number]  Arrival date and time: 01/25/13 1448   First Provider Initiated Contact with Patient 01/25/13 1539      No chief complaint on file.  HPI Ms. Victoria Russell is a 27 y.o. G1P0 at [redacted]w[redacted]d who was sent to MAU from the office for evaluation of hypertension in pregnancy and possible pre-eclampsia. The patient had an elevated BP at work yesterday 140s/90s and then again in the office had a similar BP. She has had headache today. She has not tried any pain medication. She has some lower abdominal pressure, but denies RUQ pain, blurred vision or seeing spots. She has had peripheral edema of the hands and feet, but it resolves with rest and elevation. The patient has had some bleeding and discharge over the last 4 days. She thinks she has lost her mucus plug. She reports good fetal movement.    OB History   Grav Para Term Preterm Abortions TAB SAB Ect Mult Living   1         0      Past Medical History  Diagnosis Date  . ADHD (attention deficit hyperactivity disorder)   . Hypothyroidism   . Hyperhidrosis   . WPW (Wolff-Parkinson-White syndrome)   . Atypical moles     dysplasia  . History of kidney stones   . Right ureteral stone 3/09  . Dysmenorrhea   . HLD (hyperlipidemia)     Past Surgical History  Procedure Laterality Date  . Tonsillectomy and adenoidectomy    . Myringotomy    . Mole removal      dysplastic  . Cystoscopy      Family History  Problem Relation Age of Onset  . Evelene Croon Parkinson White syndrome Mother   . Hypothyroidism Mother   . Basal cell carcinoma Mother   . Cancer Mother     basal cell skin CA  . Diabetes Maternal Grandfather   . Cervical cancer      MGGM  . Cervical cancer Maternal Grandmother   . Cancer Maternal Grandmother     cervical CA  . Coronary artery disease Paternal Grandmother   . Heart disease Paternal Grandmother     CAD  . Other Sister     Dermoid cyst  . Cancer Other     cervical CA    History   Substance Use Topics  . Smoking status: Never Smoker   . Smokeless tobacco: Never Used  . Alcohol Use: No     Comment: Social    Allergies: No Known Allergies  Prescriptions prior to admission  Medication Sig Dispense Refill  . flintstones complete (FLINTSTONES) 60 MG chewable tablet Chew 2 tablets by mouth daily.      . folic acid (FOLVITE) 800 MCG tablet Take 800 mcg by mouth daily.      Marland Kitchen levothyroxine (SYNTHROID, LEVOTHROID) 25 MCG tablet Take 12.5 mcg by mouth daily.      . [DISCONTINUED] nitrofurantoin, macrocrystal-monohydrate, (MACROBID) 100 MG capsule Take 1 capsule (100 mg total) by mouth 2 (two) times daily.  20 capsule  0  . [DISCONTINUED] phenazopyridine (PYRIDIUM) 100 MG tablet Take 1 tablet (100 mg total) by mouth 3 (three) times daily as needed for pain.  10 tablet  0  . [DISCONTINUED] phenazopyridine (PYRIDIUM) 100 MG tablet Take 1 tablet (100 mg total) by mouth 3 (three) times daily as needed for pain.  20 tablet  0    Review of Systems  Constitutional: Negative for fever  and malaise/fatigue.  Eyes: Negative for blurred vision and double vision.  Gastrointestinal: Negative for nausea, vomiting and abdominal pain.  Genitourinary: Negative for dysuria, urgency and frequency.       + mucus discharge and spotting  Neurological: Positive for headaches.   Physical Exam   Blood pressure 123/83, pulse 84, temperature 97.3 F (36.3 C), temperature source Oral, resp. rate 18, height 5\' 4"  (1.626 m), weight 195 lb (88.451 kg), last menstrual period 05/06/2012, SpO2 99.00%.  Physical Exam  Constitutional: She is oriented to person, place, and time. She appears well-developed and well-nourished. No distress.  HENT:  Head: Normocephalic and atraumatic.  Cardiovascular: Normal rate, regular rhythm and normal heart sounds.   Respiratory: Effort normal and breath sounds normal. No respiratory distress.  GI: Soft. Bowel sounds are normal. She exhibits no distension and no  mass. There is no tenderness. There is no rebound and no guarding.  Musculoskeletal: Normal range of motion. She exhibits no edema and no tenderness.  No clonus  Neurological: She is alert and oriented to person, place, and time. She has normal reflexes.  Skin: Skin is warm. No erythema.  Psychiatric: She has a normal mood and affect.   Filed Vitals:   01/25/13 1645 01/25/13 1700 01/25/13 1715 01/25/13 1747  BP: 118/80 100/81 113/72 122/75  Pulse: 87 87 82 81  Temp:      TempSrc:      Resp:    18  Height:      Weight:      SpO2:        Results for orders placed during the hospital encounter of 01/25/13 (from the past 24 hour(s))  URINALYSIS, ROUTINE W REFLEX MICROSCOPIC     Status: Abnormal   Collection Time    01/25/13  3:52 PM      Result Value Range   Color, Urine YELLOW  YELLOW   APPearance CLEAR  CLEAR   Specific Gravity, Urine 1.020  1.005 - 1.030   pH 7.0  5.0 - 8.0   Glucose, UA 100 (*) NEGATIVE mg/dL   Hgb urine dipstick MODERATE (*) NEGATIVE   Bilirubin Urine NEGATIVE  NEGATIVE   Ketones, ur NEGATIVE  NEGATIVE mg/dL   Protein, ur NEGATIVE  NEGATIVE mg/dL   Urobilinogen, UA 0.2  0.0 - 1.0 mg/dL   Nitrite NEGATIVE  NEGATIVE   Leukocytes, UA NEGATIVE  NEGATIVE  URINE MICROSCOPIC-ADD ON     Status: Abnormal   Collection Time    01/25/13  3:52 PM      Result Value Range   Squamous Epithelial / LPF FEW (*) RARE   WBC, UA 0-2  <3 WBC/hpf   RBC / HPF 7-10  <3 RBC/hpf   Bacteria, UA MANY (*) RARE   Urine-Other MUCOUS PRESENT    CBC     Status: Abnormal   Collection Time    01/25/13  4:05 PM      Result Value Range   WBC 10.7 (*) 4.0 - 10.5 K/uL   RBC 4.21  3.87 - 5.11 MIL/uL   Hemoglobin 11.5 (*) 12.0 - 15.0 g/dL   HCT 16.1 (*) 09.6 - 04.5 %   MCV 81.5  78.0 - 100.0 fL   MCH 27.3  26.0 - 34.0 pg   MCHC 33.5  30.0 - 36.0 g/dL   RDW 40.9  81.1 - 91.4 %   Platelets 261  150 - 400 K/uL  COMPREHENSIVE METABOLIC PANEL     Status: Abnormal   Collection Time  01/25/13  4:05 PM      Result Value Range   Sodium 135  135 - 145 mEq/L   Potassium 4.1  3.5 - 5.1 mEq/L   Chloride 101  96 - 112 mEq/L   CO2 24  19 - 32 mEq/L   Glucose, Bld 107 (*) 70 - 99 mg/dL   BUN 6  6 - 23 mg/dL   Creatinine, Ser 4.09  0.50 - 1.10 mg/dL   Calcium 9.4  8.4 - 81.1 mg/dL   Total Protein 6.3  6.0 - 8.3 g/dL   Albumin 2.6 (*) 3.5 - 5.2 g/dL   AST 14  0 - 37 U/L   ALT 10  0 - 35 U/L   Alkaline Phosphatase 100  39 - 117 U/L   Total Bilirubin 0.2 (*) 0.3 - 1.2 mg/dL   GFR calc non Af Amer >90  >90 mL/min   GFR calc Af Amer >90  >90 mL/min  URIC ACID     Status: None   Collection Time    01/25/13  4:05 PM      Result Value Range   Uric Acid, Serum 2.5  2.4 - 7.0 mg/dL    MAU Course  Procedures None  MDM Discussed patient with Dr. Henderson Cloud. He feels comfortable discharging patient today to follow-up as scheduled on Thursday for induction.   Assessment and Plan  A: Elevated BP in pregnancy  P: Discharge home Patient advised of warning signs for pre-eclampsia Patient encouraged to keep scheduled follow-up for induction this week Patient may return to MAU as needed or if her condition were to change or worsen  Freddi Starr, PA-C  01/25/2013, 3:39 PM

## 2013-01-26 ENCOUNTER — Encounter (HOSPITAL_COMMUNITY): Payer: Self-pay | Admitting: *Deleted

## 2013-01-26 ENCOUNTER — Telehealth (HOSPITAL_COMMUNITY): Payer: Self-pay | Admitting: *Deleted

## 2013-01-26 NOTE — Telephone Encounter (Signed)
Preadmission screen  

## 2013-01-28 ENCOUNTER — Encounter (HOSPITAL_COMMUNITY): Payer: Self-pay

## 2013-01-28 ENCOUNTER — Inpatient Hospital Stay (HOSPITAL_COMMUNITY)
Admission: RE | Admit: 2013-01-28 | Discharge: 2013-02-01 | DRG: 766 | Disposition: A | Discharge status: Home / Self Care | Payer: 59 | Source: Ambulatory Visit | Attending: Obstetrics & Gynecology | Admitting: Obstetrics & Gynecology

## 2013-01-28 DIAGNOSIS — O139 Gestational [pregnancy-induced] hypertension without significant proteinuria, unspecified trimester: Principal | ICD-10-CM | POA: Diagnosis present

## 2013-01-28 DIAGNOSIS — E039 Hypothyroidism, unspecified: Secondary | ICD-10-CM

## 2013-01-28 LAB — COMPREHENSIVE METABOLIC PANEL
ALT: 9 U/L (ref 0–35)
AST: 14 U/L (ref 0–37)
Albumin: 2.7 g/dL — ABNORMAL LOW (ref 3.5–5.2)
Calcium: 9.5 mg/dL (ref 8.4–10.5)
Sodium: 134 mEq/L — ABNORMAL LOW (ref 135–145)
Total Protein: 6.1 g/dL (ref 6.0–8.3)

## 2013-01-28 LAB — CBC
Hemoglobin: 11.9 g/dL — ABNORMAL LOW (ref 12.0–15.0)
MCHC: 33.8 g/dL (ref 30.0–36.0)
Platelets: 293 10*3/uL (ref 150–400)
RDW: 13.3 % (ref 11.5–15.5)

## 2013-01-28 MED ORDER — MISOPROSTOL 25 MCG QUARTER TABLET
25.0000 ug | ORAL_TABLET | ORAL | Status: DC | PRN
  Administered 2013-01-28 – 2013-01-29 (×2): 25 ug via VAGINAL
  Filled 2013-01-28 (×2): qty 0.25

## 2013-01-28 MED ORDER — LACTATED RINGERS IV SOLN
INTRAVENOUS | Status: DC
  Administered 2013-01-28: 20:00:00 via INTRAVENOUS
  Administered 2013-01-29: 125 mL/h via INTRAVENOUS
  Administered 2013-01-29: 06:00:00 via INTRAVENOUS

## 2013-01-28 MED ORDER — IBUPROFEN 600 MG PO TABS: 600.0000 mg | ORAL_TABLET | Freq: Four times a day (QID) | ORAL | Status: DC | PRN

## 2013-01-28 MED ORDER — OXYTOCIN 40 UNITS IN LACTATED RINGERS INFUSION - SIMPLE MED: 62.5000 mL/h | INTRAVENOUS | Status: DC

## 2013-01-28 MED ORDER — OXYCODONE-ACETAMINOPHEN 5-325 MG PO TABS: 1.0000 | ORAL_TABLET | ORAL | Status: DC | PRN

## 2013-01-28 MED ORDER — ACETAMINOPHEN 325 MG PO TABS: 650.0000 mg | ORAL_TABLET | ORAL | Status: DC | PRN

## 2013-01-28 MED ORDER — TERBUTALINE SULFATE 1 MG/ML IJ SOLN: 0.2500 mg | Freq: Once | INTRAMUSCULAR | Status: AC | PRN

## 2013-01-28 MED ORDER — CITRIC ACID-SODIUM CITRATE 334-500 MG/5ML PO SOLN
30.0000 mL | ORAL | Status: DC | PRN
  Administered 2013-01-29: 30 mL via ORAL
  Filled 2013-01-28 (×2): qty 15

## 2013-01-28 MED ORDER — OXYTOCIN BOLUS FROM INFUSION: 500.0000 mL | INTRAVENOUS | Status: DC

## 2013-01-28 MED ORDER — LIDOCAINE HCL (PF) 1 % IJ SOLN: 30.0000 mL | INTRAMUSCULAR | Status: DC | PRN

## 2013-01-28 MED ORDER — LACTATED RINGERS IV SOLN
500.0000 mL | INTRAVENOUS | Status: DC | PRN
  Administered 2013-01-29 (×2): 500 mL via INTRAVENOUS

## 2013-01-28 MED ORDER — FLEET ENEMA 7-19 GM/118ML RE ENEM: 1.0000 | ENEMA | RECTAL | Status: DC | PRN

## 2013-01-28 MED ORDER — ONDANSETRON HCL 4 MG/2ML IJ SOLN: 4.0000 mg | Freq: Four times a day (QID) | INTRAMUSCULAR | Status: DC | PRN

## 2013-01-28 MED ORDER — ZOLPIDEM TARTRATE 5 MG PO TABS: 5.0000 mg | ORAL_TABLET | Freq: Every evening | ORAL | Status: DC | PRN

## 2013-01-29 ENCOUNTER — Encounter (HOSPITAL_COMMUNITY)
Admission: RE | Disposition: A | Discharge status: Home / Self Care | Payer: Self-pay | Source: Ambulatory Visit | Attending: Obstetrics & Gynecology

## 2013-01-29 ENCOUNTER — Encounter (HOSPITAL_COMMUNITY): Payer: Self-pay | Admitting: Anesthesiology

## 2013-01-29 ENCOUNTER — Encounter (HOSPITAL_COMMUNITY): Payer: Self-pay

## 2013-01-29 ENCOUNTER — Inpatient Hospital Stay (HOSPITAL_COMMUNITY): Payer: 59 | Admitting: Anesthesiology

## 2013-01-29 LAB — TYPE AND SCREEN
ABO/RH(D): A POS
Antibody Screen: NEGATIVE

## 2013-01-29 LAB — RPR: RPR Ser Ql: NONREACTIVE

## 2013-01-29 LAB — CBC
HCT: 34.3 % — ABNORMAL LOW (ref 36.0–46.0)
MCV: 81.5 fL (ref 78.0–100.0)
RBC: 4.21 MIL/uL (ref 3.87–5.11)
WBC: 13.6 10*3/uL — ABNORMAL HIGH (ref 4.0–10.5)

## 2013-01-29 SURGERY — Surgical Case
Anesthesia: Epidural | Site: Abdomen | Wound class: Clean Contaminated

## 2013-01-29 MED ORDER — MORPHINE SULFATE 0.5 MG/ML IJ SOLN
INTRAMUSCULAR | Status: AC
  Filled 2013-01-29: qty 10

## 2013-01-29 MED ORDER — NALOXONE HCL 1 MG/ML IJ SOLN: 1.0000 ug/kg/h | INTRAVENOUS | Status: DC | PRN

## 2013-01-29 MED ORDER — DIBUCAINE 1 % RE OINT: 1.0000 "application " | TOPICAL_OINTMENT | RECTAL | Status: DC | PRN

## 2013-01-29 MED ORDER — EPHEDRINE 5 MG/ML INJ
INTRAVENOUS | Status: AC
  Filled 2013-01-29: qty 10

## 2013-01-29 MED ORDER — LEVOTHYROXINE SODIUM 25 MCG PO TABS
12.5000 ug | ORAL_TABLET | Freq: Every day | ORAL | Status: DC
  Administered 2013-01-30 – 2013-01-31 (×2): 12.5 ug via ORAL
  Filled 2013-01-29 (×4): qty 0.5

## 2013-01-29 MED ORDER — NALOXONE HCL 0.4 MG/ML IJ SOLN: 0.4000 mg | INTRAMUSCULAR | Status: DC | PRN

## 2013-01-29 MED ORDER — SIMETHICONE 80 MG PO CHEW: 80.0000 mg | CHEWABLE_TABLET | ORAL | Status: DC | PRN

## 2013-01-29 MED ORDER — SCOPOLAMINE 1 MG/3DAYS TD PT72
MEDICATED_PATCH | TRANSDERMAL | Status: AC
  Filled 2013-01-29: qty 1

## 2013-01-29 MED ORDER — TERBUTALINE SULFATE 1 MG/ML IJ SOLN
0.2500 mg | Freq: Once | INTRAMUSCULAR | Status: AC
  Administered 2013-01-29: 0.25 mg via SUBCUTANEOUS

## 2013-01-29 MED ORDER — FENTANYL 2.5 MCG/ML BUPIVACAINE 1/10 % EPIDURAL INFUSION (WH - ANES)
INTRAMUSCULAR | Status: DC | PRN
  Administered 2013-01-29: 14 mL/h via EPIDURAL

## 2013-01-29 MED ORDER — TERBUTALINE SULFATE 1 MG/ML IJ SOLN
INTRAMUSCULAR | Status: AC
  Filled 2013-01-29: qty 1

## 2013-01-29 MED ORDER — LANOLIN HYDROUS EX OINT: 1.0000 "application " | TOPICAL_OINTMENT | CUTANEOUS | Status: DC | PRN

## 2013-01-29 MED ORDER — FENTANYL 2.5 MCG/ML BUPIVACAINE 1/10 % EPIDURAL INFUSION (WH - ANES)
14.0000 mL/h | INTRAMUSCULAR | Status: DC | PRN
  Filled 2013-01-29: qty 125

## 2013-01-29 MED ORDER — SCOPOLAMINE 1 MG/3DAYS TD PT72
1.0000 | MEDICATED_PATCH | Freq: Once | TRANSDERMAL | Status: AC
  Administered 2013-01-29: 1.5 mg via TRANSDERMAL

## 2013-01-29 MED ORDER — MORPHINE SULFATE (PF) 0.5 MG/ML IJ SOLN
INTRAMUSCULAR | Status: DC | PRN
  Administered 2013-01-29: 1 mg via INTRAVENOUS

## 2013-01-29 MED ORDER — MORPHINE SULFATE (PF) 0.5 MG/ML IJ SOLN
INTRAMUSCULAR | Status: DC | PRN
  Administered 2013-01-29: 4 mg via EPIDURAL

## 2013-01-29 MED ORDER — DIPHENHYDRAMINE HCL 25 MG PO CAPS: 25.0000 mg | ORAL_CAPSULE | ORAL | Status: DC | PRN

## 2013-01-29 MED ORDER — LACTATED RINGERS IV SOLN
INTRAVENOUS | Status: DC | PRN
  Administered 2013-01-29 (×2): via INTRAVENOUS

## 2013-01-29 MED ORDER — CEFAZOLIN SODIUM-DEXTROSE 2-3 GM-% IV SOLR
2.0000 g | Freq: Once | INTRAVENOUS | Status: AC
  Administered 2013-01-29: 2 g via INTRAVENOUS
  Filled 2013-01-29: qty 50

## 2013-01-29 MED ORDER — LIDOCAINE HCL (PF) 1 % IJ SOLN
INTRAMUSCULAR | Status: DC | PRN
  Administered 2013-01-29 (×2): 8 mL

## 2013-01-29 MED ORDER — NALBUPHINE HCL 10 MG/ML IJ SOLN
5.0000 mg | INTRAMUSCULAR | Status: DC | PRN
  Administered 2013-01-29: 10 mg via INTRAVENOUS
  Filled 2013-01-29 (×2): qty 1

## 2013-01-29 MED ORDER — OXYCODONE-ACETAMINOPHEN 5-325 MG PO TABS: 1.0000 | ORAL_TABLET | ORAL | Status: DC | PRN

## 2013-01-29 MED ORDER — BUPIVACAINE HCL (PF) 0.25 % IJ SOLN
INTRAMUSCULAR | Status: AC
  Filled 2013-01-29: qty 30

## 2013-01-29 MED ORDER — OXYTOCIN 10 UNIT/ML IJ SOLN
INTRAMUSCULAR | Status: AC
  Filled 2013-01-29: qty 4

## 2013-01-29 MED ORDER — LACTATED RINGERS IV SOLN
500.0000 mL | Freq: Once | INTRAVENOUS | Status: AC
  Administered 2013-01-29: 500 mL via INTRAVENOUS

## 2013-01-29 MED ORDER — EPHEDRINE 5 MG/ML INJ
10.0000 mg | INTRAVENOUS | Status: DC | PRN
  Filled 2013-01-29: qty 4

## 2013-01-29 MED ORDER — MENTHOL 3 MG MT LOZG: 1.0000 | LOZENGE | OROMUCOSAL | Status: DC | PRN

## 2013-01-29 MED ORDER — DIPHENHYDRAMINE HCL 25 MG PO CAPS: 25.0000 mg | ORAL_CAPSULE | Freq: Four times a day (QID) | ORAL | Status: DC | PRN

## 2013-01-29 MED ORDER — LACTATED RINGERS IV SOLN
INTRAVENOUS | Status: DC
  Administered 2013-01-29: 19:00:00 via INTRAVENOUS

## 2013-01-29 MED ORDER — IBUPROFEN 600 MG PO TABS
600.0000 mg | ORAL_TABLET | Freq: Four times a day (QID) | ORAL | Status: DC
  Administered 2013-01-29 – 2013-02-01 (×11): 600 mg via ORAL
  Filled 2013-01-29 (×11): qty 1

## 2013-01-29 MED ORDER — SODIUM BICARBONATE 8.4 % IV SOLN
INTRAVENOUS | Status: DC | PRN
  Administered 2013-01-29: 5 mL via EPIDURAL

## 2013-01-29 MED ORDER — WITCH HAZEL-GLYCERIN EX PADS: 1.0000 "application " | MEDICATED_PAD | CUTANEOUS | Status: DC | PRN

## 2013-01-29 MED ORDER — DIPHENHYDRAMINE HCL 50 MG/ML IJ SOLN: 12.5000 mg | INTRAMUSCULAR | Status: DC | PRN

## 2013-01-29 MED ORDER — ONDANSETRON HCL 4 MG/2ML IJ SOLN
INTRAMUSCULAR | Status: DC | PRN
  Administered 2013-01-29: 4 mg via INTRAVENOUS

## 2013-01-29 MED ORDER — KETOROLAC TROMETHAMINE 60 MG/2ML IM SOLN
INTRAMUSCULAR | Status: AC
  Administered 2013-01-29: 60 mg via INTRAMUSCULAR
  Filled 2013-01-29: qty 2

## 2013-01-29 MED ORDER — PRENATAL MULTIVITAMIN CH
1.0000 | ORAL_TABLET | Freq: Every day | ORAL | Status: DC
  Filled 2013-01-29: qty 1

## 2013-01-29 MED ORDER — TETANUS-DIPHTH-ACELL PERTUSSIS 5-2.5-18.5 LF-MCG/0.5 IM SUSP: 0.5000 mL | Freq: Once | INTRAMUSCULAR | Status: DC

## 2013-01-29 MED ORDER — OXYTOCIN 10 UNIT/ML IJ SOLN
40.0000 [IU] | INTRAVENOUS | Status: DC | PRN
  Administered 2013-01-29: 40 [IU] via INTRAVENOUS

## 2013-01-29 MED ORDER — SODIUM BICARBONATE 8.4 % IV SOLN
INTRAVENOUS | Status: AC
  Filled 2013-01-29: qty 50

## 2013-01-29 MED ORDER — KETOROLAC TROMETHAMINE 30 MG/ML IJ SOLN: 30.0000 mg | Freq: Four times a day (QID) | INTRAMUSCULAR | Status: AC | PRN

## 2013-01-29 MED ORDER — PROMETHAZINE HCL 25 MG/ML IJ SOLN
12.5000 mg | Freq: Once | INTRAMUSCULAR | Status: AC
  Administered 2013-01-29: 12.5 mg via INTRAVENOUS
  Filled 2013-01-29: qty 1

## 2013-01-29 MED ORDER — FENTANYL CITRATE 0.05 MG/ML IJ SOLN: 25.0000 ug | INTRAMUSCULAR | Status: DC | PRN

## 2013-01-29 MED ORDER — LIDOCAINE-EPINEPHRINE (PF) 2 %-1:200000 IJ SOLN
INTRAMUSCULAR | Status: AC
  Filled 2013-01-29: qty 20

## 2013-01-29 MED ORDER — SENNOSIDES-DOCUSATE SODIUM 8.6-50 MG PO TABS
2.0000 | ORAL_TABLET | Freq: Every day | ORAL | Status: DC
  Administered 2013-01-29 – 2013-01-31 (×3): 2 via ORAL

## 2013-01-29 MED ORDER — SIMETHICONE 80 MG PO CHEW
80.0000 mg | CHEWABLE_TABLET | Freq: Three times a day (TID) | ORAL | Status: DC
  Administered 2013-01-29 – 2013-02-01 (×10): 80 mg via ORAL

## 2013-01-29 MED ORDER — DIPHENHYDRAMINE HCL 50 MG/ML IJ SOLN: 25.0000 mg | INTRAMUSCULAR | Status: DC | PRN

## 2013-01-29 MED ORDER — BUPIVACAINE HCL (PF) 0.25 % IJ SOLN
INTRAMUSCULAR | Status: DC | PRN
  Administered 2013-01-29: 6 mL

## 2013-01-29 MED ORDER — EPHEDRINE 5 MG/ML INJ
10.0000 mg | INTRAVENOUS | Status: AC | PRN
  Administered 2013-01-29 (×2): 10 mg via INTRAVENOUS

## 2013-01-29 MED ORDER — ONDANSETRON HCL 4 MG/2ML IJ SOLN
INTRAMUSCULAR | Status: AC
  Filled 2013-01-29: qty 2

## 2013-01-29 MED ORDER — OXYTOCIN 40 UNITS IN LACTATED RINGERS INFUSION - SIMPLE MED: 62.5000 mL/h | INTRAVENOUS | Status: AC

## 2013-01-29 MED ORDER — ACETAMINOPHEN 10 MG/ML IV SOLN
1000.0000 mg | Freq: Four times a day (QID) | INTRAVENOUS | Status: AC | PRN
  Filled 2013-01-29: qty 100

## 2013-01-29 MED ORDER — PHENYLEPHRINE 40 MCG/ML (10ML) SYRINGE FOR IV PUSH (FOR BLOOD PRESSURE SUPPORT)
80.0000 ug | PREFILLED_SYRINGE | INTRAVENOUS | Status: DC | PRN
  Filled 2013-01-29: qty 5

## 2013-01-29 MED ORDER — ZOLPIDEM TARTRATE 5 MG PO TABS: 5.0000 mg | ORAL_TABLET | Freq: Every evening | ORAL | Status: DC | PRN

## 2013-01-29 MED ORDER — BUTORPHANOL TARTRATE 1 MG/ML IJ SOLN
1.0000 mg | Freq: Once | INTRAMUSCULAR | Status: AC
Start: 2013-01-29 — End: 2013-01-29
  Administered 2013-01-29: 1 mg via INTRAVENOUS
  Filled 2013-01-29: qty 1

## 2013-01-29 MED ORDER — METOCLOPRAMIDE HCL 5 MG/ML IJ SOLN: 10.0000 mg | Freq: Three times a day (TID) | INTRAMUSCULAR | Status: DC | PRN

## 2013-01-29 MED ORDER — ONDANSETRON HCL 4 MG PO TABS: 4.0000 mg | ORAL_TABLET | ORAL | Status: DC | PRN

## 2013-01-29 MED ORDER — NALBUPHINE HCL 10 MG/ML IJ SOLN
5.0000 mg | INTRAMUSCULAR | Status: DC | PRN
  Administered 2013-01-29: 5 mg via SUBCUTANEOUS
  Filled 2013-01-29 (×2): qty 1

## 2013-01-29 MED ORDER — ONDANSETRON HCL 4 MG/2ML IJ SOLN: 4.0000 mg | INTRAMUSCULAR | Status: DC | PRN

## 2013-01-29 MED ORDER — PHENYLEPHRINE 40 MCG/ML (10ML) SYRINGE FOR IV PUSH (FOR BLOOD PRESSURE SUPPORT): 80.0000 ug | PREFILLED_SYRINGE | INTRAVENOUS | Status: DC | PRN

## 2013-01-29 MED ORDER — ONDANSETRON HCL 4 MG/2ML IJ SOLN: 4.0000 mg | Freq: Three times a day (TID) | INTRAMUSCULAR | Status: DC | PRN

## 2013-01-29 MED ORDER — SODIUM CHLORIDE 0.9 % IJ SOLN: 3.0000 mL | INTRAMUSCULAR | Status: DC | PRN

## 2013-01-29 MED ORDER — MEPERIDINE HCL 25 MG/ML IJ SOLN: 6.2500 mg | INTRAMUSCULAR | Status: DC | PRN

## 2013-01-29 MED ORDER — KETOROLAC TROMETHAMINE 60 MG/2ML IM SOLN: 60.0000 mg | Freq: Once | INTRAMUSCULAR | Status: AC | PRN

## 2013-01-29 MED ORDER — FENTANYL CITRATE 0.05 MG/ML IJ SOLN
INTRAMUSCULAR | Status: AC
  Filled 2013-01-29: qty 2

## 2013-01-29 SURGICAL SUPPLY — 31 items
BARRIER ADHS 3X4 INTERCEED (GAUZE/BANDAGES/DRESSINGS) IMPLANT
CLOTH BEACON ORANGE TIMEOUT ST (SAFETY) ×2 IMPLANT
CONTAINER PREFILL 10% NBF 15ML (MISCELLANEOUS) IMPLANT
DERMABOND ADVANCED (GAUZE/BANDAGES/DRESSINGS) ×1
DERMABOND ADVANCED .7 DNX12 (GAUZE/BANDAGES/DRESSINGS) ×1 IMPLANT
DRAPE LG THREE QUARTER DISP (DRAPES) ×2 IMPLANT
DRSG OPSITE POSTOP 4X10 (GAUZE/BANDAGES/DRESSINGS) ×2 IMPLANT
DURAPREP 26ML APPLICATOR (WOUND CARE) ×2 IMPLANT
ELECT REM PT RETURN 9FT ADLT (ELECTROSURGICAL) ×2
ELECTRODE REM PT RTRN 9FT ADLT (ELECTROSURGICAL) ×1 IMPLANT
EXTRACTOR VACUUM M CUP 4 TUBE (SUCTIONS) ×2 IMPLANT
GLOVE BIO SURGEON STRL SZ 6.5 (GLOVE) ×2 IMPLANT
GOWN STRL REIN XL XLG (GOWN DISPOSABLE) ×4 IMPLANT
KIT ABG SYR 3ML LUER SLIP (SYRINGE) IMPLANT
NEEDLE HYPO 22GX1.5 SAFETY (NEEDLE) IMPLANT
NEEDLE HYPO 25X5/8 SAFETYGLIDE (NEEDLE) ×2 IMPLANT
NS IRRIG 1000ML POUR BTL (IV SOLUTION) ×2 IMPLANT
PACK C SECTION WH (CUSTOM PROCEDURE TRAY) ×2 IMPLANT
PAD OB MATERNITY 4.3X12.25 (PERSONAL CARE ITEMS) ×2 IMPLANT
SLEEVE SCD COMPRESS KNEE MED (MISCELLANEOUS) IMPLANT
STAPLER VISISTAT 35W (STAPLE) IMPLANT
SUT CHROMIC 0 CTX 36 (SUTURE) ×4 IMPLANT
SUT PLAIN 0 NONE (SUTURE) IMPLANT
SUT PLAIN 2 0 XLH (SUTURE) IMPLANT
SUT VIC AB 0 CT1 27 (SUTURE) ×3
SUT VIC AB 0 CT1 27XBRD ANBCTR (SUTURE) ×3 IMPLANT
SUT VIC AB 4-0 KS 27 (SUTURE) ×2 IMPLANT
SYR CONTROL 10ML LL (SYRINGE) IMPLANT
TOWEL OR 17X24 6PK STRL BLUE (TOWEL DISPOSABLE) ×6 IMPLANT
TRAY FOLEY CATH 14FR (SET/KITS/TRAYS/PACK) ×2 IMPLANT
WATER STERILE IRR 1000ML POUR (IV SOLUTION) ×2 IMPLANT

## 2013-01-29 NOTE — Anesthesia Postprocedure Evaluation (Signed)
  Anesthesia Post-op Note  Patient: Victoria Russell  Procedure(s) Performed: Procedure(s): CESAREAN SECTION (N/A)  Patient is awake, responsive, moving her legs, and has signs of resolution of her numbness. Pain and nausea are reasonably well controlled. Vital signs are stable and clinically acceptable. Oxygen saturation is clinically acceptable. There are no apparent anesthetic complications at this time. Patient is ready for discharge.

## 2013-01-29 NOTE — Anesthesia Procedure Notes (Signed)
Epidural Patient location during procedure: OB Start time: 01/29/2013 6:32 AM End time: 01/29/2013 6:36 AM  Staffing Anesthesiologist: Sandrea Hughs Performed by: anesthesiologist   Preanesthetic Checklist Completed: patient identified, site marked, surgical consent, pre-op evaluation, timeout performed, IV checked, risks and benefits discussed and monitors and equipment checked  Epidural Patient position: sitting Prep: site prepped and draped and DuraPrep Patient monitoring: continuous pulse ox and blood pressure Approach: midline Injection technique: LOR air  Needle:  Needle type: Tuohy  Needle gauge: 17 G Needle length: 9 cm and 9 Needle insertion depth: 5 cm cm Catheter type: closed end flexible Catheter size: 19 Gauge Catheter at skin depth: 10 cm Test dose: negative and Other  Assessment Sensory level: T9 Events: blood not aspirated, injection not painful, no injection resistance, negative IV test and no paresthesia

## 2013-01-29 NOTE — Progress Notes (Signed)
Cardiac monitor applied.

## 2013-01-29 NOTE — H&P (Signed)
27 year old G 1 P 0 at 32 w 2 days admitted for induction of labor last night secondary to [facility]. She has had progressive hypertension over the past 2 weeks. BPs in office 140s / high 90s several times. +1 proteinuria last week. Also progressive edema - generalized. Last night - the patient was admitted and received 2 cytotec. She SROM this am and received Stadol and Epidural prior to my assuming her care at 073. History remarkable for WPW.   NKDA  GBBS is negative  Afebrile VSS  Fetal heart rate - reactive overnight. However, over past 2 hours progressive absent variability. No scalp stim with my 2 last cervical exams.  Now in past 30 minutes I notice progressive repetitive late decelerations.  IUPC placed and contractions are adequate General alert and oriented - oxygen mask on Lung CTAB Car RRR Abdomen leopolds 7 1/2  Cervix is 90% 4 cm -2 Vertex No scalp stimulation  IMPRESSION: IUP at 38 w 2 days Non reassuring FHR  PLAN: At this point, since she is remote from vaginal delivery I recommend C Section. Risks reviewed with patient Consent is signed.

## 2013-01-29 NOTE — Anesthesia Preprocedure Evaluation (Signed)
Anesthesia Evaluation  Patient identified by MRN, date of birth, ID band Patient awake    Reviewed: Allergy & Precautions, H&P , NPO status , Patient's Chart, lab work & pertinent test results  Airway Mallampati: II TM Distance: >3 FB Neck ROM: full    Dental no notable dental hx.    Pulmonary neg pulmonary ROS,    Pulmonary exam normal       Cardiovascular + dysrhythmias     Neuro/Psych    GI/Hepatic negative GI ROS, Neg liver ROS,   Endo/Other    Renal/GU negative Renal ROS  negative genitourinary   Musculoskeletal negative musculoskeletal ROS (+)   Abdominal Normal abdominal exam  (+)   Peds negative pediatric ROS (+)  Hematology negative hematology ROS (+)   Anesthesia Other Findings   Reproductive/Obstetrics (+) Pregnancy                           Anesthesia Physical Anesthesia Plan  ASA: II  Anesthesia Plan: Epidural   Post-op Pain Management:    Induction:   Airway Management Planned:   Additional Equipment:   Intra-op Plan:   Post-operative Plan:   Informed Consent: I have reviewed the patients History and Physical, chart, labs and discussed the procedure including the risks, benefits and alternatives for the proposed anesthesia with the patient or authorized representative who has indicated his/her understanding and acceptance.     Plan Discussed with:   Anesthesia Plan Comments:         Anesthesia Quick Evaluation

## 2013-01-29 NOTE — Anesthesia Postprocedure Evaluation (Signed)
Anesthesia Post Note  Patient: Victoria Russell  Procedure(s) Performed: Procedure(s) (LRB): CESAREAN SECTION (N/A)  Anesthesia type: Epidural  Patient location: Mother/Baby  Post pain: Pain level controlled  Post assessment: Post-op Vital signs reviewed  Last Vitals:  Filed Vitals:   01/29/13 1531  BP: 117/68  Pulse: 77  Temp: 36.7 C  Resp: 18    Post vital signs: Reviewed  Level of consciousness:alert  Complications: No apparent anesthesia complications

## 2013-01-29 NOTE — Progress Notes (Signed)
To OR per stretcher 

## 2013-01-29 NOTE — Transfer of Care (Signed)
Immediate Anesthesia Transfer of Care Note  Patient: Victoria Russell  Procedure(s) Performed: Procedure(s): CESAREAN SECTION (N/A)  Patient Location: PACU  Anesthesia Type:Epidural  Level of Consciousness: awake and alert   Airway & Oxygen Therapy: Patient Spontanous Breathing  Post-op Assessment: Report given to PACU RN and Post -op Vital signs reviewed and stable  Post vital signs: Reviewed and stable  Complications: No apparent anesthesia complications

## 2013-01-29 NOTE — Brief Op Note (Signed)
01/28/2013 - 01/29/2013  9:40 AM  PATIENT:  Victoria Russell  27 y.o. female  PRE-OPERATIVE DIAGNOSIS: IUP at 25 w 2  Hypertension Non Reassuring FHR Pattern  POST-OPERATIVE DIAGNOSIS:  Same   PROCEDURE:  Procedure(s): CESAREAN SECTION (N/A)  SURGEON:  Surgeon(s) and Role:    * Jeani Hawking, MD - Primary  PHYSICIAN ASSISTANT:   ASSISTANTS: none   ANESTHESIA:   epidural  EBL:  Total I/O In: 1000 [I.V.:1000] Out: 700 [Urine:100; Blood:600]  BLOOD ADMINISTERED:none  DRAINS: Urinary Catheter (Foley)   LOCAL MEDICATIONS USED:  MARCAINE     SPECIMEN:  No Specimen  DISPOSITION OF SPECIMEN:  N/A  COUNTS:  YES  TOURNIQUET:  * No tourniquets in log *  DICTATION: .Other Dictation: Dictation Number [id number]  PLAN OF CARE: Admit to inpatient   PATIENT DISPOSITION:  PACU - hemodynamically stable.   Delay start of Pharmacological VTE agent (>24hrs) due to surgical blood loss or risk of bleeding: not applicable

## 2013-01-29 NOTE — Progress Notes (Signed)
Pt transferred to room 172 per bed in preparation for cardiac monitoring

## 2013-01-30 LAB — CBC
HCT: 27.3 % — ABNORMAL LOW (ref 36.0–46.0)
Hemoglobin: 8.9 g/dL — ABNORMAL LOW (ref 12.0–15.0)
MCH: 27.1 pg (ref 26.0–34.0)
MCHC: 32.6 g/dL (ref 30.0–36.0)
RDW: 13.7 % (ref 11.5–15.5)

## 2013-01-30 NOTE — Progress Notes (Signed)
Subjective: Postpartum Day 1: Cesarean Delivery Patient reports incisional pain, tolerating PO and no problems voiding.    Objective: Vital signs in last 24 hours: Temp:  [97.5 F (36.4 C)-99.1 F (37.3 C)] 98.5 F (36.9 C) (04/26 0915) Pulse Rate:  [68-98] 88 (04/26 0915) Resp:  [14-20] 18 (04/26 0915) BP: (102-120)/(45-69) 109/57 mmHg (04/26 0915) SpO2:  [95 %-99 %] 97 % (04/26 0915)  Physical Exam:  General: alert, cooperative and appears stated age Lochia: appropriate Uterine Fundus: firm Incision: healing well, no significant drainage DVT Evaluation: No evidence of DVT seen on physical exam.   Recent Labs  01/29/13 0600 01/30/13 0630  HGB 11.3* 8.9*  HCT 34.3* 27.3*    Assessment/Plan: Status post Cesarean section. Doing well postoperatively.  Continue current care.  Mylah Baynes L 01/30/2013, 9:46 AM

## 2013-01-30 NOTE — Op Note (Signed)
Victoria Russell, Victoria Russell                ACCOUNT NO.:  [id number]  MEDICAL RECORD NO.:  [id number]  LOCATION:  9115                          FACILITY:  [facility]  PHYSICIAN:  Macklen Wilhoite L. Maddex Garlitz, M.D.DATE OF BIRTH:  29-Aug-1986  DATE OF PROCEDURE:  01/29/2013 DATE OF DISCHARGE:                              OPERATIVE REPORT   PREOPERATIVE DIAGNOSIS:  Intrauterine pregnancy at 38 weeks and 2 days, nonreassuring fetal heart rate and gestational hypertension.  POSTOP DIAGNOSIS:  Intrauterine pregnancy at 38 weeks and 2 days, nonreassuring fetal heart rate and gestational hypertension.  PROCEDURE:  Primary low-transverse cesarean section.  SURGEON:  Kately Graffam L. Vincente Poli, M.D.  ANESTHESIA:  Epidural.  ESTIMATED BLOOD LOSS:  Less than 500 mL.  COMPLICATIONS:  None.  PROCEDURE IN DETAIL:  The patient was taken to the operating room.  Her epidural had been dosed.  She was prepped and draped in usual sterile fashion.  Time-out was performed.  A low transverse incision was made, carried down to the fascia.  Fascia scored to the midline and extended laterally.  Rectus muscles was separated in the midline.  The peritoneum was entered bluntly.  Peritoneal incision was then stretched.  The lower uterine segment was identified.  Bladder flap was created sharply and bladder blade was inserted.  A low transverse incision was made in the uterus.  Uterus was entered using a hemostat.  Amniotic fluid was clear. The baby was delivered easily with a vacuum extractor with 1 pull, the baby was a female infant, Apgars 9 and 9.  Cord was clamped and cut. The baby was handed to the awaiting neonatal team.  Cord pH was obtained.  Cord blood was obtained.  The placenta was manually removed and noted to be normal, intact with a three-vessel cord.  The uterus was exteriorized and cleared of all clots and debris.  The uterine incision was closed in 1 layer using 0 chromic in a running locked stitch.  The uterus was  returned to the abdomen.  Irrigation performed.  Peritoneum was closed using 0 Vicryl.  The fascia was closed using 0 Vicryl starting each corner and meeting in the midline.  After irrigation of the subcutaneous layer, the skin was closed with a 4-0 Vicryl on a Keith needle.  Local was infiltrated. Dermabond was applied.  All sponge, lap, and instrument counts were correct x2.  The patient went to recovery room stable condition.     Helios Kohlmann L. Vincente Poli, M.D.     Victoria Russell  D:  01/29/2013  T:  01/30/2013  Job:  [id number]

## 2013-01-31 NOTE — Progress Notes (Signed)
Subjective: Postpartum Day 2: Cesarean Delivery Patient reports incisional pain, tolerating PO and no problems voiding.    Objective: Vital signs in last 24 hours: Temp:  [97.5 F (36.4 C)-98.5 F (36.9 C)] 97.5 F (36.4 C) (04/27 0508) Pulse Rate:  [76-88] 76 (04/27 0508) Resp:  [18] 18 (04/27 0508) BP: (109-124)/(57-75) 124/75 mmHg (04/27 0508) SpO2:  [97 %] 97 % (04/26 0915)  Physical Exam:  General: alert, cooperative and appears stated age Lochia: appropriate Uterine Fundus: firm Incision: healing well, no significant drainage, no dehiscence, no significant erythema DVT Evaluation: No evidence of DVT seen on physical exam.   Recent Labs  01/29/13 0600 01/30/13 0630  HGB 11.3* 8.9*  HCT 34.3* 27.3*    Assessment/Plan: Status post Cesarean section. Doing well postoperatively.  Continue current care Discharge home tomorrow.  Tomia Enlow L 01/31/2013, 7:21 AM

## 2013-02-01 NOTE — Discharge Summary (Signed)
Obstetric Discharge Summary Reason for Admission: induction of labor Prenatal Procedures: ultrasound Intrapartum Procedures: cesarean: low cervical, transverse Postpartum Procedures: none Complications-Operative and Postpartum: none Hemoglobin  Date Value Range Status  01/30/2013 8.9* 12.0 - 15.0 g/dL Final     DELTA CHECK NOTED     REPEATED TO VERIFY     HCT  Date Value Range Status  01/30/2013 27.3* 36.0 - 46.0 % Final    Physical Exam:  General: alert, cooperative and appears stated age Lochia: appropriate Uterine Fundus: firm Incision: honeycomb dressing noted with small old drainage noted on R margin of bandage. No evidence of active bleeding DVT Evaluation: No evidence of DVT seen on physical exam. Negative Homan's sign. No cords or calf tenderness. No significant calf/ankle edema.  Discharge Diagnoses: Term Pregnancy-delivered  Discharge Information: Date: 02/01/2013 Activity: pelvic rest Diet: routine Medications: PNV, Ibuprofen and synthroid Condition: improved Instructions: refer to practice specific booklet Discharge to: home   Newborn Data: Live born female  Birth Weight: 6 lb 15.5 oz (3160 g) APGAR: 9, 9  Home with mother.  Latice Waitman G 02/01/2013, 8:09 AM

## 2013-02-02 ENCOUNTER — Encounter (HOSPITAL_COMMUNITY): Payer: Self-pay | Admitting: Obstetrics and Gynecology

## 2013-02-04 ENCOUNTER — Ambulatory Visit (HOSPITAL_COMMUNITY)
Admit: 2013-02-04 | Discharge: 2013-02-04 | Disposition: A | Discharge status: Home / Self Care | Payer: 59 | Attending: Obstetrics and Gynecology | Admitting: Obstetrics and Gynecology

## 2013-02-04 NOTE — Lactation Note (Signed)
Adult Lactation Consultation Outpatient Visit Note  Patient Name: Victoria Russell        "Denny Peon"                                                             todays weight 6.8,2960 Date of Birth: [birth year]                       Gestational Age at Delivery: Unknown Type of Delivery: C Section  Breastfeeding History: Frequency of Breastfeeding:every 3-4 hours  Length of Feeding: 10-15  mins Voids: 7 Stools: 4 greenish   Supplementing / Method: Pumping:  Type of Pump:Medela Pump n Style   Frequency:every 3-4 hrs  Volume:  20-80ml, total of 800 ml in last 24 hours  Comments: Consult scheduled on discharge day for feeding assessment. Infant one week old today. Infant had difficult latching during hospital stay. Mother has been using a #20 nipple shield with difficulty. Infant has been feeding every 3-4 hours. Mother has supplemented with 20-30 ml of EBM and formula for several feedings using a dropper.    Consultation Evaluation: Assist mother with proper latch without using nipple shield. Infant sustained latch 20 mins and took 54 ml.  Initial Feeding Assessment: Pre-feed Weight:2960 Post-feed Weight:3014 Amount Transferred:83ml Comments:  Additional Feeding Assessment:several attempts to latch infant to second breast. Infant very satisfied. Pre-feed Weight: Post-feed Weight: Amount Transferred: Comments:  Additional Feeding Assessment: Pre-feed Weight: Post-feed Weight: Amount Transferred: Comments:  Total Breast milk Transferred this Visit: 54ml Total Supplement Given:   Additional Interventions: Mother inst to conitnue to cue base feed infant and to offer breast at least every 3hrs.  inst to use good breast compression and breastfeed without nipple shield if possible.  Encouraged good wake up exercixes. inst to offer EBM 20-30 ml after each feeding with method of choice. inst to pump after each feeding using good massage technique piror to pumping.  Encouraged to  nap and increase intake of flds.   Follow-Up  PRN    Stevan Born [facility] 02/04/2013, 10:42 AM

## 2013-02-08 NOTE — Progress Notes (Signed)
Post discharge chart review completed.  

## 2013-03-03 ENCOUNTER — Telehealth: Payer: Self-pay | Admitting: *Deleted

## 2013-03-03 NOTE — Telephone Encounter (Signed)
Debbie notified and she said she will have the babies stay at home and not attend the party so they will not be exposed to shingles

## 2013-03-03 NOTE — Telephone Encounter (Signed)
Pt's mother left voicemail saying they are having a graduation party this week and pt's Aunt has shingles and should her Aunt be around pt and mainly pt's very young children/ baby, she wants to know what risk would the Aunt be putting the pt and her kids at being exposed to shingles, please advise

## 2013-03-03 NOTE — Telephone Encounter (Signed)
Someone with shingles can give chickenpox to someone who isn't immune (hasn't had chicken pox or got the vaccine) Direct exposure to someone with shingles is definitely not okay for those under 26 year of age

## 2013-03-09 NOTE — MAU Note (Signed)
Chart audited for QI 

## 2013-04-01 ENCOUNTER — Other Ambulatory Visit: Payer: Self-pay | Admitting: Obstetrics and Gynecology

## 2013-05-03 ENCOUNTER — Other Ambulatory Visit: Payer: Self-pay | Admitting: *Deleted

## 2013-05-03 ENCOUNTER — Telehealth: Payer: Self-pay

## 2013-05-03 DIAGNOSIS — E039 Hypothyroidism, unspecified: Secondary | ICD-10-CM

## 2013-05-03 MED ORDER — LEVOTHYROXINE SODIUM 25 MCG PO TABS: 12.5000 ug | ORAL_TABLET | Freq: Every day | ORAL | Status: DC

## 2013-05-03 NOTE — Telephone Encounter (Signed)
Lab order placed and meds sent to pharmacy with DAW, pt notified

## 2013-05-03 NOTE — Telephone Encounter (Signed)
I think it is fine to order labs at [facility] station- just future order tsh please for hypothyroid Please call in synthroid - DAW I presume she wants to her pharmacy for 6 mo  thanks

## 2013-05-03 NOTE — Telephone Encounter (Signed)
Peggie sent a staff message; I am needing a refill on my Levothyroxine 25 mg's. I was wanting to switch back over to Synthroid. I don't have anymore refill's and I was wondering if I could get my labs checked here ([facility]) cause I'm sure Dr. Milinda Antis will want to check my levels before she refills those.Please advise.

## 2013-05-04 LAB — TSH: TSH: 1.25 u[IU]/mL (ref 0.35–5.50)

## 2013-05-05 ENCOUNTER — Encounter: Payer: Self-pay | Admitting: *Deleted

## 2013-06-01 ENCOUNTER — Other Ambulatory Visit (INDEPENDENT_AMBULATORY_CARE_PROVIDER_SITE_OTHER): Payer: 59 | Admitting: *Deleted

## 2013-06-01 DIAGNOSIS — N390 Urinary tract infection, site not specified: Secondary | ICD-10-CM

## 2013-06-01 LAB — POCT URINALYSIS DIPSTICK
Blood, UA: NEGATIVE
Ketones, UA: 15
Protein, UA: >=300
Protein, UA: >=300
Spec Grav, UA: <=1.005
Urobilinogen, UA: >=8
pH, UA: 5

## 2013-06-01 MED ORDER — CIPROFLOXACIN HCL 500 MG PO TABS: 500.0000 mg | ORAL_TABLET | Freq: Two times a day (BID) | ORAL | Status: DC

## 2013-06-02 ENCOUNTER — Other Ambulatory Visit: Payer: Self-pay | Admitting: Internal Medicine

## 2013-06-02 ENCOUNTER — Other Ambulatory Visit (INDEPENDENT_AMBULATORY_CARE_PROVIDER_SITE_OTHER): Payer: 59 | Admitting: *Deleted

## 2013-06-02 DIAGNOSIS — N390 Urinary tract infection, site not specified: Secondary | ICD-10-CM

## 2013-06-02 LAB — POCT URINALYSIS DIPSTICK
Nitrite, UA: POSITIVE
Spec Grav, UA: 1.02
pH, UA: 5

## 2013-06-02 LAB — URINE CULTURE: Colony Count: NO GROWTH

## 2013-06-02 MED ORDER — SULFAMETHOXAZOLE-TMP DS 800-160 MG PO TABS: 1.0000 | ORAL_TABLET | Freq: Two times a day (BID) | ORAL | Status: DC

## 2013-09-28 ENCOUNTER — Telehealth: Payer: Self-pay | Admitting: Family Medicine

## 2013-09-28 MED ORDER — FLUTICASONE PROPIONATE 50 MCG/ACT NA SUSP: 2.0000 | Freq: Every day | NASAL | Status: DC | PRN

## 2013-09-28 NOTE — Addendum Note (Signed)
Addended by: Shon Millet on: 09/28/2013 09:14 AM   Modules accepted: Orders

## 2013-09-28 NOTE — Telephone Encounter (Signed)
Pt is suffering from nasal allergies.  She is taking Claratin, but feels she may need an allergy nasal spray.  Please advise. Thank you.

## 2013-09-28 NOTE — Telephone Encounter (Signed)
Spoke with Dr. Milinda Antis and she is okay having pt try Flonase nasal spray, Rx sent to pharmacy, I advise Kyandra Rx sent and to let us know if she has any problems

## 2013-10-29 ENCOUNTER — Ambulatory Visit (INDEPENDENT_AMBULATORY_CARE_PROVIDER_SITE_OTHER): Payer: 59 | Admitting: Internal Medicine

## 2013-10-29 ENCOUNTER — Encounter: Payer: Self-pay | Admitting: Internal Medicine

## 2013-10-29 VITALS — BP 132/86 | HR 106 | Temp 98.1°F | Wt 148.0 lb

## 2013-10-29 DIAGNOSIS — J069 Acute upper respiratory infection, unspecified: Secondary | ICD-10-CM

## 2013-10-29 MED ORDER — AZITHROMYCIN 250 MG PO TABS: ORAL_TABLET | ORAL | Status: DC

## 2013-10-29 NOTE — Progress Notes (Signed)
HPI  Pt presents to the clinic today with c/o fatigue and nasal congestion. This started 3 days ago. She is blowing green mucous out of her nose. She has had some associated nausea, vomiting and diarrhea. She denies fever, chills or body aches. She has been using Flonase with good relief. She has had sick contacts.  Review of Systems      Past Medical History  Diagnosis Date  . ADHD (attention deficit hyperactivity disorder)   . Hypothyroidism   . Hyperhidrosis   . WPW (Wolff-Parkinson-White syndrome)   . Atypical moles     dysplasia  . History of kidney stones     2012  . Right ureteral stone 3/09  . Dysmenorrhea   . HLD (hyperlipidemia)   . Hx of varicella   . Wolff-Parkinson-White (WPW) syndrome   . Pregnancy induced hypertension     Family History  Problem Relation Age of Onset  . [place] White syndrome Mother   . Hypothyroidism Mother   . Basal cell carcinoma Mother   . Cancer Mother     basal cell skin CA  . Diabetes Maternal Grandfather   . Cervical cancer      MGGM  . Cervical cancer Maternal Grandmother   . Cancer Maternal Grandmother     cervical CA  . Coronary artery disease Paternal Grandmother   . Heart disease Paternal Grandmother     CAD  . Other Sister     Dermoid cyst  . Cancer Other     cervical CA  . Depression Father     History   Social History  . Marital Status: Married    Spouse Name: N/A    Number of Children: 0  . Years of Education: N/A   Occupational History  . [employer] [place]   Social History Main Topics  . Smoking status: Never Smoker   . Smokeless tobacco: Never Used  . Alcohol Use: No     Comment: Social  . Drug Use: No  . Sexual Activity: No   Other Topics Concern  . Not on file   Social History Narrative   School for [employer]    No Known Allergies   Constitutional: Positive fatigue. Denies headache, fever or abrupt weight changes.  HEENT:  Positive nasal congestion. Denies eye  redness, eye pain, pressure behind the eyes, facial pain, ear pain, ringing in the ears, wax buildup, runny nose or bloody nose. Respiratory:  Denies cough, difficulty breathing or shortness of breath.  Cardiovascular: Denies chest pain, chest tightness, palpitations or swelling in the hands or feet.   No other specific complaints in a complete review of systems (except as listed in HPI above).  Objective:   BP 132/86  Pulse 106  Temp(Src) 98.1 F (36.7 C) (Oral)  Wt 148 lb (67.132 kg)  SpO2 98% Wt Readings from Last 3 Encounters:  10/29/13 148 lb (67.132 kg)  01/25/13 195 lb (88.451 kg)  12/11/12 183 lb 8 oz (83.235 kg)     General: Appears her stated age, well developed, well nourished in NAD. HEENT: Head: normal shape and size; Eyes: sclera white, no icterus, conjunctiva pink, PERRLA and EOMs intact; Ears: Tm's gray and intact, normal light reflex; Nose: mucosa pink and moist, septum midline; Throat/Mouth: + PND. Teeth present, mucosa and moist, no exudate noted, no lesions or ulcerations noted.  Neck: Neck supple, trachea midline. No massses, lumps or thyromegaly present.  Cardiovascular: Normal rate and rhythm. S1,S2 noted.  No  murmur, rubs or gallops noted. No JVD or BLE edema. No carotid bruits noted. Pulmonary/Chest: Normal effort and positive vesicular breath sounds. No respiratory distress. No wheezes, rales or ronchi noted.      Assessment & Plan:   Upper Respiratory Infection, likely viral at this point:  Get some rest and drink plenty of water Continue Flonase for the nasal congestion- try to hold off on the Afrin Given the fact that her daughter is sick, and it is nearing the weekend will give eRx for Azithromax x 5 days   RTC as needed or if symptoms persist.

## 2013-10-29 NOTE — Progress Notes (Signed)
Pre-visit discussion using our clinic review tool. No additional management support is needed unless otherwise documented below in the visit note.  

## 2013-10-29 NOTE — Patient Instructions (Signed)

## 2013-11-06 ENCOUNTER — Other Ambulatory Visit: Payer: Self-pay | Admitting: Family Medicine

## 2013-11-08 ENCOUNTER — Other Ambulatory Visit: Payer: Self-pay | Admitting: *Deleted

## 2013-11-08 MED ORDER — LEVOTHYROXINE SODIUM 25 MCG PO TABS: 12.5000 ug | ORAL_TABLET | Freq: Every day | ORAL | Status: DC

## 2013-11-08 NOTE — Telephone Encounter (Signed)
Due to insurance pt has to transfer Rx to [facility] pharmacy. New Rx sent to [facility]

## 2013-12-09 ENCOUNTER — Ambulatory Visit (INDEPENDENT_AMBULATORY_CARE_PROVIDER_SITE_OTHER): Payer: 59 | Admitting: Family Medicine

## 2013-12-09 ENCOUNTER — Encounter: Payer: Self-pay | Admitting: Family Medicine

## 2013-12-09 VITALS — BP 122/76 | HR 76 | Temp 98.0°F | Wt 138.0 lb

## 2013-12-09 DIAGNOSIS — N390 Urinary tract infection, site not specified: Secondary | ICD-10-CM

## 2013-12-09 DIAGNOSIS — R319 Hematuria, unspecified: Secondary | ICD-10-CM

## 2013-12-09 LAB — POCT URINALYSIS DIPSTICK
Bilirubin, UA: NEGATIVE
Glucose, UA: NEGATIVE
Ketones, UA: NEGATIVE
NITRITE UA: NEGATIVE
PH UA: 6.5
Spec Grav, UA: <=1.005
Urobilinogen, UA: NEGATIVE

## 2013-12-09 MED ORDER — SULFAMETHOXAZOLE-TMP DS 800-160 MG PO TABS: 1.0000 | ORAL_TABLET | Freq: Two times a day (BID) | ORAL | Status: DC

## 2013-12-09 NOTE — Patient Instructions (Signed)
Drink plenty of water and start the antibiotics today.  We'll contact you with your lab report.  Take care.   

## 2013-12-09 NOTE — Progress Notes (Signed)
Pre visit review using our clinic review tool, if applicable. No additional management support is needed unless otherwise documented below in the visit note.  Dysuria: yes, pressure with urination, blood in urine duration of symptoms: 2 day abdominal pain: lower abd pain fevers:no back pain:no Vomiting:no Took AZO with some relief, after giving urine sample This doesn't feel like renal stones prev.   Meds, vitals, and allergies reviewed.   ROS: See HPI.  Otherwise negative.    GEN: nad, alert and oriented HEENT: mucous membranes moist NECK: supple CV: rrr.  PULM: ctab, no inc wob ABD: soft, +bs, suprapubic area tender EXT: no edema SKIN: no acute rash BACK: no CVA pain

## 2013-12-09 NOTE — Assessment & Plan Note (Signed)
Septra, ucx, fluids, AZO, f/u prn.  She agrees. Nontoxic.

## 2013-12-10 LAB — URINE CULTURE
COLONY COUNT: NO GROWTH
Organism ID, Bacteria: NO GROWTH

## 2014-01-03 ENCOUNTER — Telehealth: Payer: Self-pay | Admitting: Family Medicine

## 2014-01-03 MED ORDER — DOXYLAMINE-PYRIDOXINE 10-10 MG PO TBEC: DELAYED_RELEASE_TABLET | ORAL | Status: DC

## 2014-01-03 NOTE — Telephone Encounter (Signed)
diclegis for nausea - to try in first trimester of pregnancy

## 2014-01-20 ENCOUNTER — Telehealth: Payer: Self-pay | Admitting: *Deleted

## 2014-01-20 MED ORDER — ERYTHROMYCIN 5 MG/GM OP OINT: 1.0000 "application " | TOPICAL_OINTMENT | Freq: Three times a day (TID) | OPHTHALMIC | Status: DC

## 2014-01-20 NOTE — Telephone Encounter (Signed)
Pt thinks she may have pink eye and would like a refill of Zymaxid, she was given this before. Pt is being sent home please advise. Uses [facility].

## 2014-01-20 NOTE — Telephone Encounter (Signed)
I would have to see her before px anything - since pink eye can be viral or bacterial or allergic - and also I do not think that particular eye drop would be the best pick in pregnancy Please set her up with first avail provider-thanks

## 2014-01-20 NOTE — Telephone Encounter (Signed)
Erythromycin ointment would be safest in pregnancy (class B rather than C).  Will send rx to pharmacy.

## 2014-01-20 NOTE — Telephone Encounter (Signed)
Left voicemail requesting pt to call office 

## 2014-01-20 NOTE — Telephone Encounter (Signed)
Josselyn wanted me to forward this message to Dr. Deborra Medina since she looked at her eye this morning before she left and request an Rx sent to pharmacy, pt aware that Zymaxid isn't the best option since she is pregnant but will use what ever Dr. Deborra Medina suggest

## 2014-01-20 NOTE — Telephone Encounter (Signed)
Cristie notified Rx sent to pharmacy

## 2014-01-21 LAB — OB RESULTS CONSOLE ABO/RH: RH TYPE: POSITIVE

## 2014-01-21 LAB — OB RESULTS CONSOLE HEPATITIS B SURFACE ANTIGEN: HEP B S AG: NEGATIVE

## 2014-01-21 LAB — OB RESULTS CONSOLE ANTIBODY SCREEN: ANTIBODY SCREEN: NEGATIVE

## 2014-01-21 LAB — OB RESULTS CONSOLE RPR: RPR: NONREACTIVE

## 2014-01-21 LAB — OB RESULTS CONSOLE HIV ANTIBODY (ROUTINE TESTING): HIV: NONREACTIVE

## 2014-01-21 LAB — OB RESULTS CONSOLE RUBELLA ANTIBODY, IGM: RUBELLA: IMMUNE

## 2014-02-04 ENCOUNTER — Other Ambulatory Visit: Payer: Self-pay | Admitting: Obstetrics and Gynecology

## 2014-03-23 ENCOUNTER — Encounter: Payer: 59 | Admitting: Family Medicine

## 2014-04-06 ENCOUNTER — Encounter: Payer: 59 | Admitting: Family Medicine

## 2014-07-06 ENCOUNTER — Encounter: Payer: Self-pay | Admitting: Internal Medicine

## 2014-07-06 ENCOUNTER — Ambulatory Visit (INDEPENDENT_AMBULATORY_CARE_PROVIDER_SITE_OTHER): Payer: 59 | Admitting: Internal Medicine

## 2014-07-06 VITALS — BP 128/80 | HR 100 | Temp 98.2°F | Resp 16 | Wt 180.8 lb

## 2014-07-06 DIAGNOSIS — B029 Zoster without complications: Secondary | ICD-10-CM | POA: Insufficient documentation

## 2014-07-06 MED ORDER — VALACYCLOVIR HCL 1 G PO TABS: 1000.0000 mg | ORAL_TABLET | Freq: Three times a day (TID) | ORAL | Status: DC

## 2014-07-06 NOTE — Progress Notes (Signed)
Subjective:    Patient ID: Victoria Russell, female    DOB: 04-16-1986, 28 y.o.   MRN: [id number]  HPI Concerned about possible infection on buttocks First noticed on Sunday (3 days ago) ?bug bite Tried neosporin but continues to worsen  No fever Burning sensation and ache  Current Outpatient Prescriptions on File Prior to Visit  Medication Sig Dispense Refill  . flintstones complete (FLINTSTONES) 60 MG chewable tablet Chew 2 tablets by mouth daily.      . fluticasone (FLONASE) 50 MCG/ACT nasal spray Place 2 sprays into both nostrils daily as needed for allergies or rhinitis.  16 g  5  . levothyroxine (SYNTHROID, LEVOTHROID) 25 MCG tablet Take 0.5 tablets (12.5 mcg total) by mouth daily.  45 tablet  3   No current facility-administered medications on file prior to visit.    No Known Allergies  Past Medical History  Diagnosis Date  . ADHD (attention deficit hyperactivity disorder)   . Hypothyroidism   . Hyperhidrosis   . WPW (Wolff-Parkinson-White syndrome)   . Atypical moles     dysplasia  . History of kidney stones     20 12  . Right ureteral stone 3/09  . Dysmenorrhea   . HLD (hyperlipidemia)   . Hx of varicella   . Wolff-Parkinson-White (WPW) syndrome   . Pregnancy induced hypertension     Past Surgical History  Procedure Laterality Date  . Tonsillectomy and adenoidectomy    . Myringotomy    . Mole removal      dysplastic  . Cystoscopy    . Cesarean section N/A 01/29/2013    Procedure: CESAREAN SECTION;  Surgeon: Cyril Mourning, MD;  Location: [facility];  Service: Obstetrics;  Laterality: N/A;    Family History  Problem Relation Age of Onset  . [place] White syndrome Mother   . Hypothyroidism Mother   . Basal cell carcinoma Mother   . Cancer Mother     basal cell skin CA  . Diabetes Maternal Grandfather   . Cervical cancer      MGGM  . Cervical cancer Maternal Grandmother   . Cancer Maternal Grandmother     cervical CA  . Coronary artery  disease Paternal Grandmother   . Heart disease Paternal Grandmother     CAD  . Other Sister     Dermoid cyst  . Cancer Other     cervical CA  . Depression Father     History   Social History  . Marital Status: Married    Spouse Name: N/A    Number of Children: 0  . Years of Education: N/A   Occupational History  . [employer]   Social History Main Topics  . Smoking status: Never Smoker   . Smokeless tobacco: Never Used  . Alcohol Use: No     Comment: Social  . Drug Use: No  . Sexual Activity: No   Other Topics Concern  . Not on file   Social History Narrative   School for [employer]   Review of Systems Kidney stone a week ago---passed that No vomiting Slight nausea though No known exposures to skin infection    Objective:   Physical Exam  Constitutional: She appears well-developed and well-nourished. No distress.  Skin:  Clump of red raised lesions on right buttock with 3 other lesions more lateral 1 area looks slightly vesicular No ulceration or sig induration          Assessment &  Plan:

## 2014-07-06 NOTE — Assessment & Plan Note (Signed)
The appearance and burning discomfort is most consistent with zoster Doesn't really look like cellulitis but it will need further evaluation if not clear Currently 33+ weeks gestation In general, acyclovir/valacyclovir is considered safe in pregnancy --and used as prophylaxis before delivery with history of genital herpes. Will go ahead with Rx but she will check with her Ob before starting

## 2014-07-06 NOTE — Progress Notes (Signed)
Pre visit review using our clinic review tool, if applicable. No additional management support is needed unless otherwise documented below in the visit note. 

## 2014-08-08 ENCOUNTER — Encounter: Payer: Self-pay | Admitting: Internal Medicine

## 2014-08-18 NOTE — Patient Instructions (Addendum)
   Your procedure is scheduled on:  Monday, Nov 16  Enter through the [employer] [facility] at: 6 AM Pick up the phone at the desk and dial [phone] and inform us of your arrival.  Please call this number if you have any problems the morning of surgery: [phone]  Remember: Do not eat or drink after midnight: Sunday Take these medicines the morning of surgery with a SIP OF WATER: synthroid  Do not wear jewelry, make-up, or FINGER nail polish No metal in your hair or on your body. Do not wear lotions, powders, perfumes.  You may wear deodorant.  Do not bring valuables to the hospital. Contacts, dentures or bridgework may not be worn into surgery.  Leave suitcase in the car. After Surgery it may be brought to your room. For patients being admitted to the hospital, checkout time is 11:00am the day of discharge.  Home with husband Victoria Russell cell [phone].

## 2014-08-19 ENCOUNTER — Encounter (HOSPITAL_COMMUNITY)
Admission: RE | Admit: 2014-08-19 | Discharge: 2014-08-19 | Disposition: A | Discharge status: Home / Self Care | Payer: 59 | Source: Ambulatory Visit | Attending: Obstetrics and Gynecology | Admitting: Obstetrics and Gynecology

## 2014-08-19 ENCOUNTER — Encounter (HOSPITAL_COMMUNITY): Payer: Self-pay

## 2014-08-19 HISTORY — DX: Anxiety disorder, unspecified: F41.9

## 2014-08-19 LAB — CBC
HCT: 34.2 % — ABNORMAL LOW (ref 36.0–46.0)
Hemoglobin: 11.2 g/dL — ABNORMAL LOW (ref 12.0–15.0)
MCH: 25.1 pg — AB (ref 26.0–34.0)
MCHC: 32.7 g/dL (ref 30.0–36.0)
MCV: 76.5 fL — AB (ref 78.0–100.0)
PLATELETS: 252 10*3/uL (ref 150–400)
RBC: 4.47 MIL/uL (ref 3.87–5.11)
RDW: 15.3 % (ref 11.5–15.5)
WBC: 11.7 10*3/uL — ABNORMAL HIGH (ref 4.0–10.5)

## 2014-08-19 LAB — TYPE AND SCREEN
ABO/RH(D): A POS
Antibody Screen: NEGATIVE

## 2014-08-19 LAB — RPR

## 2014-08-19 LAB — ABO/RH: ABO/RH(D): A POS

## 2014-08-19 NOTE — H&P (Signed)
Victoria Russell is a 28 y.o. G 2 P 1001 at 39 weeks presents for repeat ltcs History OB History    Gravida Para Term Preterm AB TAB SAB Ectopic Multiple Living   2 1 1       1      Past Medical History  Diagnosis Date  . ADHD (attention deficit hyperactivity disorder)   . Hypothyroidism   . Hyperhidrosis   . WPW (Wolff-Parkinson-White syndrome)   . Atypical moles     dysplasia  . History of kidney stones     2012  . Right ureteral stone 3/09  . Dysmenorrhea   . HLD (hyperlipidemia)   . Hx of varicella   . Wolff-Parkinson-White (WPW) syndrome   . Pregnancy induced hypertension    Past Surgical History  Procedure Laterality Date  . Tonsillectomy and adenoidectomy    . Myringotomy    . Mole removal      dysplastic  . Cystoscopy    . Cesarean section N/A 01/29/2013    Procedure: CESAREAN SECTION;  Surgeon: Cyril Mourning, MD;  Location: [facility];  Service: Obstetrics;  Laterality: N/A;   Family History: family history includes Basal cell carcinoma in her mother; Cancer in her maternal grandmother, mother, and other; Cervical cancer in her maternal grandmother and another family member; Coronary artery disease in her paternal grandmother; Depression in her father; Diabetes in her maternal grandfather; Heart disease in her paternal grandmother; Hypothyroidism in her mother; Other in her sister; Wolff Parkinson White syndrome in her mother. Social History:  reports that she has never smoked. She has never used smokeless tobacco. She reports that she does not drink alcohol or use illicit drugs.   Prenatal Transfer Tool  Maternal Diabetes: No Genetic Screening: Normal Maternal Ultrasounds/Referrals: Normal Fetal Ultrasounds or other Referrals:  None Maternal Substance Abuse:  No Significant Maternal Medications:  None Significant Maternal Lab Results:  None Other Comments:  None  Review of Systems  All other systems reviewed and are negative.     There were no vitals taken  for this visit. Maternal Exam:  Abdomen: Fetal presentation: vertex     Fetal Exam Fetal State Assessment: Category I - tracings are normal.     Physical Exam  Constitutional: She appears well-developed.  HENT:  Head: Normocephalic.  Eyes: Pupils are equal, round, and reactive to light.  Neck: Normal range of motion.  Cardiovascular: Normal rate.   Respiratory: Effort normal and breath sounds normal.  GI: Soft.    Prenatal labs: ABO, Rh:   Antibody:   Rubella:   RPR:    HBsAg:    HIV:    GBS:     Assessment/Plan: IUP at 39 weeks Previous C Section Repeat LTCS Risks reviewed  Consent signed  Elcie Pelster L 08/19/2014, 8:50 AM

## 2014-08-22 ENCOUNTER — Inpatient Hospital Stay (HOSPITAL_COMMUNITY)
Admission: RE | Admit: 2014-08-22 | Discharge: 2014-08-24 | DRG: 765 | Disposition: A | Discharge status: Home / Self Care | Payer: 59 | Source: Ambulatory Visit | Attending: Obstetrics and Gynecology | Admitting: Obstetrics and Gynecology

## 2014-08-22 ENCOUNTER — Inpatient Hospital Stay (HOSPITAL_COMMUNITY): Payer: 59 | Admitting: Anesthesiology

## 2014-08-22 ENCOUNTER — Encounter (HOSPITAL_COMMUNITY): Payer: Self-pay | Admitting: Anesthesiology

## 2014-08-22 ENCOUNTER — Encounter (HOSPITAL_COMMUNITY)
Admission: RE | Disposition: A | Discharge status: Home / Self Care | Payer: Self-pay | Source: Ambulatory Visit | Attending: Obstetrics and Gynecology

## 2014-08-22 DIAGNOSIS — O99344 Other mental disorders complicating childbirth: Secondary | ICD-10-CM | POA: Diagnosis present

## 2014-08-22 DIAGNOSIS — O9942 Diseases of the circulatory system complicating childbirth: Secondary | ICD-10-CM | POA: Diagnosis present

## 2014-08-22 DIAGNOSIS — F909 Attention-deficit hyperactivity disorder, unspecified type: Secondary | ICD-10-CM | POA: Diagnosis present

## 2014-08-22 DIAGNOSIS — O3421 Maternal care for scar from previous cesarean delivery: Secondary | ICD-10-CM | POA: Diagnosis present

## 2014-08-22 DIAGNOSIS — O133 Gestational [pregnancy-induced] hypertension without significant proteinuria, third trimester: Secondary | ICD-10-CM | POA: Diagnosis present

## 2014-08-22 DIAGNOSIS — O99284 Endocrine, nutritional and metabolic diseases complicating childbirth: Secondary | ICD-10-CM | POA: Diagnosis present

## 2014-08-22 DIAGNOSIS — N858 Other specified noninflammatory disorders of uterus: Secondary | ICD-10-CM | POA: Diagnosis present

## 2014-08-22 DIAGNOSIS — I456 Pre-excitation syndrome: Secondary | ICD-10-CM | POA: Diagnosis present

## 2014-08-22 DIAGNOSIS — R61 Generalized hyperhidrosis: Secondary | ICD-10-CM | POA: Diagnosis present

## 2014-08-22 DIAGNOSIS — E785 Hyperlipidemia, unspecified: Secondary | ICD-10-CM | POA: Diagnosis present

## 2014-08-22 DIAGNOSIS — E039 Hypothyroidism, unspecified: Secondary | ICD-10-CM | POA: Diagnosis present

## 2014-08-22 DIAGNOSIS — Z98891 History of uterine scar from previous surgery: Secondary | ICD-10-CM

## 2014-08-22 DIAGNOSIS — Z3A39 39 weeks gestation of pregnancy: Secondary | ICD-10-CM | POA: Diagnosis present

## 2014-08-22 SURGERY — Surgical Case
Anesthesia: Spinal | Site: Abdomen

## 2014-08-22 MED ORDER — SCOPOLAMINE 1 MG/3DAYS TD PT72: 1.0000 | MEDICATED_PATCH | Freq: Once | TRANSDERMAL | Status: DC

## 2014-08-22 MED ORDER — ONDANSETRON HCL 4 MG PO TABS: 4.0000 mg | ORAL_TABLET | ORAL | Status: DC | PRN

## 2014-08-22 MED ORDER — SIMETHICONE 80 MG PO CHEW: 80.0000 mg | CHEWABLE_TABLET | ORAL | Status: DC | PRN

## 2014-08-22 MED ORDER — SCOPOLAMINE 1 MG/3DAYS TD PT72
1.0000 | MEDICATED_PATCH | Freq: Once | TRANSDERMAL | Status: DC
  Administered 2014-08-22: 1.5 mg via TRANSDERMAL

## 2014-08-22 MED ORDER — ONDANSETRON HCL 4 MG/2ML IJ SOLN
INTRAMUSCULAR | Status: DC | PRN
  Administered 2014-08-22: 4 mg via INTRAVENOUS

## 2014-08-22 MED ORDER — CEFAZOLIN SODIUM-DEXTROSE 2-3 GM-% IV SOLR
INTRAVENOUS | Status: AC
  Filled 2014-08-22: qty 50

## 2014-08-22 MED ORDER — NALBUPHINE HCL 10 MG/ML IJ SOLN
5.0000 mg | INTRAMUSCULAR | Status: DC | PRN
  Administered 2014-08-22: 5 mg via INTRAVENOUS
  Filled 2014-08-22: qty 1

## 2014-08-22 MED ORDER — DIPHENHYDRAMINE HCL 50 MG/ML IJ SOLN: 12.5000 mg | INTRAMUSCULAR | Status: DC | PRN

## 2014-08-22 MED ORDER — OXYCODONE-ACETAMINOPHEN 5-325 MG PO TABS: 1.0000 | ORAL_TABLET | ORAL | Status: DC | PRN

## 2014-08-22 MED ORDER — NALOXONE HCL 0.4 MG/ML IJ SOLN: 0.4000 mg | INTRAMUSCULAR | Status: DC | PRN

## 2014-08-22 MED ORDER — PHENYLEPHRINE 8 MG IN D5W 100 ML (0.08MG/ML) PREMIX OPTIME
INJECTION | INTRAVENOUS | Status: DC | PRN
  Administered 2014-08-22: 60 ug/min via INTRAVENOUS

## 2014-08-22 MED ORDER — SIMETHICONE 80 MG PO CHEW
80.0000 mg | CHEWABLE_TABLET | ORAL | Status: DC
  Administered 2014-08-22 – 2014-08-23 (×3): 80 mg via ORAL
  Filled 2014-08-22 (×2): qty 1

## 2014-08-22 MED ORDER — NALBUPHINE HCL 10 MG/ML IJ SOLN
5.0000 mg | Freq: Once | INTRAMUSCULAR | Status: AC | PRN
  Administered 2014-08-22: 5 mg via SUBCUTANEOUS

## 2014-08-22 MED ORDER — FENTANYL CITRATE 0.05 MG/ML IJ SOLN
INTRAMUSCULAR | Status: AC
  Filled 2014-08-22: qty 2

## 2014-08-22 MED ORDER — LACTATED RINGERS IV SOLN
Freq: Once | INTRAVENOUS | Status: AC
  Administered 2014-08-22: 07:00:00 via INTRAVENOUS

## 2014-08-22 MED ORDER — OXYCODONE-ACETAMINOPHEN 5-325 MG PO TABS: 2.0000 | ORAL_TABLET | ORAL | Status: DC | PRN

## 2014-08-22 MED ORDER — LACTATED RINGERS IV SOLN
INTRAVENOUS | Status: DC
  Administered 2014-08-22 (×3): via INTRAVENOUS

## 2014-08-22 MED ORDER — SIMETHICONE 80 MG PO CHEW
80.0000 mg | CHEWABLE_TABLET | Freq: Three times a day (TID) | ORAL | Status: DC
  Administered 2014-08-22 – 2014-08-24 (×4): 80 mg via ORAL
  Filled 2014-08-22 (×6): qty 1

## 2014-08-22 MED ORDER — NALBUPHINE HCL 10 MG/ML IJ SOLN: 5.0000 mg | Freq: Once | INTRAMUSCULAR | Status: AC | PRN

## 2014-08-22 MED ORDER — FENTANYL CITRATE 0.05 MG/ML IJ SOLN
INTRAMUSCULAR | Status: AC
  Administered 2014-08-22: 50 ug via INTRAVENOUS
  Filled 2014-08-22: qty 2

## 2014-08-22 MED ORDER — CEFAZOLIN SODIUM-DEXTROSE 2-3 GM-% IV SOLR
2.0000 g | INTRAVENOUS | Status: AC
  Administered 2014-08-22: 2 g via INTRAVENOUS

## 2014-08-22 MED ORDER — ONDANSETRON HCL 4 MG/2ML IJ SOLN: 4.0000 mg | INTRAMUSCULAR | Status: DC | PRN

## 2014-08-22 MED ORDER — LEVOTHYROXINE SODIUM 25 MCG PO TABS
12.5000 ug | ORAL_TABLET | Freq: Every day | ORAL | Status: DC
  Administered 2014-08-23 – 2014-08-24 (×2): 12.5 ug via ORAL
  Filled 2014-08-22 (×3): qty 0.5

## 2014-08-22 MED ORDER — KETOROLAC TROMETHAMINE 30 MG/ML IJ SOLN: 15.0000 mg | Freq: Once | INTRAMUSCULAR | Status: DC | PRN

## 2014-08-22 MED ORDER — WITCH HAZEL-GLYCERIN EX PADS: 1.0000 "application " | MEDICATED_PAD | CUTANEOUS | Status: DC | PRN

## 2014-08-22 MED ORDER — ONDANSETRON HCL 4 MG/2ML IJ SOLN
INTRAMUSCULAR | Status: AC
  Filled 2014-08-22: qty 2

## 2014-08-22 MED ORDER — PHENYLEPHRINE 8 MG IN D5W 100 ML (0.08MG/ML) PREMIX OPTIME
INJECTION | INTRAVENOUS | Status: AC
  Filled 2014-08-22: qty 100

## 2014-08-22 MED ORDER — NALBUPHINE HCL 10 MG/ML IJ SOLN: 5.0000 mg | INTRAMUSCULAR | Status: DC | PRN

## 2014-08-22 MED ORDER — EPHEDRINE SULFATE 50 MG/ML IJ SOLN
INTRAMUSCULAR | Status: DC | PRN
  Administered 2014-08-22: 5 mg via INTRAVENOUS

## 2014-08-22 MED ORDER — FENTANYL CITRATE 0.05 MG/ML IJ SOLN
INTRAMUSCULAR | Status: DC | PRN
  Administered 2014-08-22: 25 ug via INTRATHECAL

## 2014-08-22 MED ORDER — SCOPOLAMINE 1 MG/3DAYS TD PT72
MEDICATED_PATCH | TRANSDERMAL | Status: AC
  Administered 2014-08-22: 1.5 mg via TRANSDERMAL
  Filled 2014-08-22: qty 1

## 2014-08-22 MED ORDER — SODIUM CHLORIDE 0.9 % IJ SOLN: 3.0000 mL | INTRAMUSCULAR | Status: DC | PRN

## 2014-08-22 MED ORDER — BUPIVACAINE IN DEXTROSE 0.75-8.25 % IT SOLN
INTRATHECAL | Status: DC | PRN
  Administered 2014-08-22: 1.6 mL via INTRATHECAL

## 2014-08-22 MED ORDER — OXYTOCIN 40 UNITS IN LACTATED RINGERS INFUSION - SIMPLE MED: 62.5000 mL/h | INTRAVENOUS | Status: AC

## 2014-08-22 MED ORDER — MEPERIDINE HCL 25 MG/ML IJ SOLN: 6.2500 mg | INTRAMUSCULAR | Status: DC | PRN

## 2014-08-22 MED ORDER — LACTATED RINGERS IV SOLN
INTRAVENOUS | Status: DC
  Administered 2014-08-22: 19:00:00 via INTRAVENOUS

## 2014-08-22 MED ORDER — PROMETHAZINE HCL 25 MG/ML IJ SOLN: 6.2500 mg | INTRAMUSCULAR | Status: DC | PRN

## 2014-08-22 MED ORDER — FENTANYL CITRATE 0.05 MG/ML IJ SOLN
25.0000 ug | INTRAMUSCULAR | Status: DC | PRN
  Administered 2014-08-22 (×5): 50 ug via INTRAVENOUS

## 2014-08-22 MED ORDER — IBUPROFEN 600 MG PO TABS: 600.0000 mg | ORAL_TABLET | Freq: Four times a day (QID) | ORAL | Status: DC | PRN

## 2014-08-22 MED ORDER — MENTHOL 3 MG MT LOZG: 1.0000 | LOZENGE | OROMUCOSAL | Status: DC | PRN

## 2014-08-22 MED ORDER — ERYTHROMYCIN 5 MG/GM OP OINT
TOPICAL_OINTMENT | OPHTHALMIC | Status: AC
  Filled 2014-08-22: qty 1

## 2014-08-22 MED ORDER — TETANUS-DIPHTH-ACELL PERTUSSIS 5-2.5-18.5 LF-MCG/0.5 IM SUSP: 0.5000 mL | Freq: Once | INTRAMUSCULAR | Status: DC

## 2014-08-22 MED ORDER — NALOXONE HCL 1 MG/ML IJ SOLN: 1.0000 ug/kg/h | INTRAMUSCULAR | Status: DC | PRN

## 2014-08-22 MED ORDER — ONDANSETRON HCL 4 MG/2ML IJ SOLN: 4.0000 mg | Freq: Three times a day (TID) | INTRAMUSCULAR | Status: DC | PRN

## 2014-08-22 MED ORDER — ZOLPIDEM TARTRATE 5 MG PO TABS: 5.0000 mg | ORAL_TABLET | Freq: Every evening | ORAL | Status: DC | PRN

## 2014-08-22 MED ORDER — LANOLIN HYDROUS EX OINT: 1.0000 "application " | TOPICAL_OINTMENT | CUTANEOUS | Status: DC | PRN

## 2014-08-22 MED ORDER — MIDAZOLAM HCL 2 MG/2ML IJ SOLN: 0.5000 mg | Freq: Once | INTRAMUSCULAR | Status: DC | PRN

## 2014-08-22 MED ORDER — BUPIVACAINE HCL (PF) 0.25 % IJ SOLN
INTRAMUSCULAR | Status: AC
  Filled 2014-08-22: qty 30

## 2014-08-22 MED ORDER — ACETAMINOPHEN 500 MG PO TABS: 1000.0000 mg | ORAL_TABLET | Freq: Four times a day (QID) | ORAL | Status: AC

## 2014-08-22 MED ORDER — KETOROLAC TROMETHAMINE 30 MG/ML IJ SOLN
30.0000 mg | Freq: Four times a day (QID) | INTRAMUSCULAR | Status: AC | PRN
  Administered 2014-08-22: 30 mg via INTRAMUSCULAR

## 2014-08-22 MED ORDER — KETOROLAC TROMETHAMINE 30 MG/ML IJ SOLN
INTRAMUSCULAR | Status: AC
Start: 2014-08-22 — End: 2014-08-22
  Administered 2014-08-22: 30 mg via INTRAMUSCULAR
  Filled 2014-08-22: qty 1

## 2014-08-22 MED ORDER — MORPHINE SULFATE (PF) 0.5 MG/ML IJ SOLN
INTRAMUSCULAR | Status: DC | PRN
  Administered 2014-08-22: .15 mg via INTRATHECAL

## 2014-08-22 MED ORDER — DIPHENHYDRAMINE HCL 25 MG PO CAPS: 25.0000 mg | ORAL_CAPSULE | Freq: Four times a day (QID) | ORAL | Status: DC | PRN

## 2014-08-22 MED ORDER — KETOROLAC TROMETHAMINE 30 MG/ML IJ SOLN: 30.0000 mg | Freq: Four times a day (QID) | INTRAMUSCULAR | Status: AC | PRN

## 2014-08-22 MED ORDER — PRENATAL MULTIVITAMIN CH
1.0000 | ORAL_TABLET | Freq: Every day | ORAL | Status: DC
  Administered 2014-08-24: 1 via ORAL
  Filled 2014-08-22 (×2): qty 1

## 2014-08-22 MED ORDER — DIBUCAINE 1 % RE OINT: 1.0000 "application " | TOPICAL_OINTMENT | RECTAL | Status: DC | PRN

## 2014-08-22 MED ORDER — NALBUPHINE HCL 10 MG/ML IJ SOLN
INTRAMUSCULAR | Status: AC
  Filled 2014-08-22: qty 1

## 2014-08-22 MED ORDER — DIPHENHYDRAMINE HCL 25 MG PO CAPS
25.0000 mg | ORAL_CAPSULE | ORAL | Status: DC | PRN
  Administered 2014-08-22: 25 mg via ORAL

## 2014-08-22 MED ORDER — MORPHINE SULFATE 0.5 MG/ML IJ SOLN
INTRAMUSCULAR | Status: AC
  Filled 2014-08-22: qty 10

## 2014-08-22 MED ORDER — SENNOSIDES-DOCUSATE SODIUM 8.6-50 MG PO TABS
2.0000 | ORAL_TABLET | ORAL | Status: DC
  Administered 2014-08-22 – 2014-08-23 (×2): 2 via ORAL
  Filled 2014-08-22 (×2): qty 2

## 2014-08-22 MED ORDER — OXYTOCIN 10 UNIT/ML IJ SOLN
40.0000 [IU] | INTRAVENOUS | Status: DC | PRN
  Administered 2014-08-22: 40 [IU] via INTRAVENOUS

## 2014-08-22 MED ORDER — IBUPROFEN 600 MG PO TABS
600.0000 mg | ORAL_TABLET | Freq: Four times a day (QID) | ORAL | Status: DC
  Administered 2014-08-22 – 2014-08-24 (×8): 600 mg via ORAL
  Filled 2014-08-22 (×8): qty 1

## 2014-08-22 MED ORDER — OXYTOCIN 10 UNIT/ML IJ SOLN
INTRAMUSCULAR | Status: AC
  Filled 2014-08-22: qty 4

## 2014-08-22 SURGICAL SUPPLY — 33 items
BARRIER ADHS 3X4 INTERCEED (GAUZE/BANDAGES/DRESSINGS) IMPLANT
CLAMP CORD UMBIL (MISCELLANEOUS) ×3 IMPLANT
CLOSURE WOUND 1/2 X4 (GAUZE/BANDAGES/DRESSINGS) ×1
CLOTH BEACON ORANGE TIMEOUT ST (SAFETY) ×3 IMPLANT
CONTAINER PREFILL 10% NBF 15ML (MISCELLANEOUS) IMPLANT
DRAPE SHEET LG 3/4 BI-LAMINATE (DRAPES) ×3 IMPLANT
DRSG OPSITE POSTOP 4X10 (GAUZE/BANDAGES/DRESSINGS) ×3 IMPLANT
DURAPREP 26ML APPLICATOR (WOUND CARE) ×3 IMPLANT
ELECT REM PT RETURN 9FT ADLT (ELECTROSURGICAL) ×3
ELECTRODE REM PT RTRN 9FT ADLT (ELECTROSURGICAL) ×1 IMPLANT
EXTRACTOR VACUUM M CUP 4 TUBE (SUCTIONS) ×2 IMPLANT
EXTRACTOR VACUUM M CUP 4' TUBE (SUCTIONS) ×1
GLOVE BIO SURGEON STRL SZ 6.5 (GLOVE) ×2 IMPLANT
GLOVE BIO SURGEONS STRL SZ 6.5 (GLOVE) ×1
GOWN STRL REUS W/TWL LRG LVL3 (GOWN DISPOSABLE) ×6 IMPLANT
KIT ABG SYR 3ML LUER SLIP (SYRINGE) IMPLANT
NEEDLE HYPO 22GX1.5 SAFETY (NEEDLE) ×3 IMPLANT
NEEDLE HYPO 25X5/8 SAFETYGLIDE (NEEDLE) ×3 IMPLANT
NS IRRIG 1000ML POUR BTL (IV SOLUTION) ×3 IMPLANT
PACK C SECTION WH (CUSTOM PROCEDURE TRAY) ×3 IMPLANT
PAD OB MATERNITY 4.3X12.25 (PERSONAL CARE ITEMS) ×3 IMPLANT
STAPLER VISISTAT 35W (STAPLE) IMPLANT
STRIP CLOSURE SKIN 1/2X4 (GAUZE/BANDAGES/DRESSINGS) ×2 IMPLANT
SUT CHROMIC 0 CTX 36 (SUTURE) ×6 IMPLANT
SUT PLAIN 0 NONE (SUTURE) IMPLANT
SUT PLAIN 2 0 XLH (SUTURE) IMPLANT
SUT VIC AB 0 CT1 27 (SUTURE) ×6
SUT VIC AB 0 CT1 27XBRD ANBCTR (SUTURE) ×3 IMPLANT
SUT VIC AB 4-0 KS 27 (SUTURE) ×3 IMPLANT
SYR CONTROL 10ML LL (SYRINGE) ×3 IMPLANT
TOWEL OR 17X24 6PK STRL BLUE (TOWEL DISPOSABLE) ×3 IMPLANT
TRAY FOLEY CATH 14FR (SET/KITS/TRAYS/PACK) ×3 IMPLANT
WATER STERILE IRR 1000ML POUR (IV SOLUTION) ×3 IMPLANT

## 2014-08-22 NOTE — Transfer of Care (Signed)
Immediate Anesthesia Transfer of Care Note  Patient: Victoria Russell  Procedure(s) Performed: Procedure(s) with comments: CESAREAN SECTION (N/A) - Repeat edc 08/29/14  Patient Location: PACU  Anesthesia Type:Spinal  Level of Consciousness: awake, alert  and oriented  Airway & Oxygen Therapy: Patient Spontanous Breathing  Post-op Assessment: Report given to PACU RN and Post -op Vital signs reviewed and stable  Post vital signs: Reviewed and stable  Complications: No apparent anesthesia complications

## 2014-08-22 NOTE — Op Note (Signed)
NAMECHRISSY, Victoria Russell                ACCOUNT NO.:  [id number]  MEDICAL RECORD NO.:  [id number]  LOCATION:  [facility]                          FACILITY:  [facility]  PHYSICIAN:  Kaveri Perras L. Alissa Pharr, M.D.DATE OF BIRTH:  1985-11-08  DATE OF PROCEDURE:  08/22/2014 DATE OF DISCHARGE:                              OPERATIVE REPORT   PREOPERATIVE DIAGNOSIS:  Intrauterine pregnancy at term and previous cesarean section.  POSTOPERATIVE DIAGNOSIS:  Intrauterine pregnancy at term and previous cesarean section.  PROCEDURE:  Repeat low transverse cesarean section.  SURGEON:  Martrice Apt L. Jurnie Garritano, MD  ANESTHESIA:  Spinal.  EBL:  600 mL.  COMPLICATIONS:  None.  DRAINS:  Foley.  DESCRIPTION OF PROCEDURE:  The patient was taken to the operating room. She was administered her spinal.  She was prepped and draped in the usual sterile fashion.  Foley catheter was inserted.  A low transverse incision was made and carried down to the fascia.  Fascia was scored in the midline and extended laterally.  Rectus muscles were separated in the midline.  The peritoneum was entered bluntly.  The peritoneal incision was then stretched.  The lower uterine segment was identified. The bladder flap was created sharply and digitally.  The bladder blade was then readjusted.  A low transverse incision was made in the uterus. Uterus was entered using a hemostat.  Amniotic fluid was clear.  The baby was delivered in cephalic presentation with 1 pull of the vacuum. The baby was a female infant, Apgars 9 at 1 minute, 9 at 5 minutes. Cord was clamped and cut.  The baby was handed to the awaiting pediatrician.  The placenta was manually removed, noted to be normal, intact with a three-vessel cord.  The uterus was exteriorized of all clots and debris.  The uterine incision was closed in 1 layer using 0 chromic in a running, locked stitch.  The peritoneum was closed using 0 Vicryl, the fascia was closed using 0 Vicryl running stitch.   After irrigation of the skin, the skin was closed with a 4-0 Vicryl on a Keith needle.  All sponge, lap, and instrument counts were correct x2.  The patient went to recovery room in stable condition.    Dujuan Stankowski L. Helane Rima, M.D.    Nevin Bloodgood  D:  08/22/2014  T:  08/22/2014  Job:  [id number]

## 2014-08-22 NOTE — Progress Notes (Signed)
H and P on the chart No significant changes Will proceed with Repeat LTCS  Consent signed 

## 2014-08-22 NOTE — Lactation Note (Signed)
This note was copied from the chart of Victoria Russell. Lactation Consultation Note  Patient Name: Victoria Russell NOIBB'C Date: 08/22/2014 Reason for consult: Initial assessment of this mom and baby at 13 hours postpartum. Mom states she attempted to breastfeed first child but due to latch difficulty, she pumped and fed ebm for 4 months.  Mom reports that her newborn has been latching easily and denies any nipple discomfort.  Mom states that she knows how to hand express her colostrum/milk.  LC encouraged frequent STS and cue feedings. Mom encouraged to feed baby 8-12 times/24 hours and with feeding cues. LC encouraged review of Baby and Me pp 9, 14 and 20-25 for STS and BF information. LC provided [employer] Resource brochure and reviewed [facility] services and list of community and web site resources.    Maternal Data Formula Feeding for Exclusion: No Has patient been taught Hand Expression?: Yes (experienced mom)  Feeding Feeding Type: Breast Fed Length of feed: 35 min  LATCH Score/Interventions Latch: Grasps breast easily, tongue down, lips flanged, rhythmical sucking.  Audible Swallowing: Spontaneous and intermittent Intervention(s): Skin to skin;Hand expression  Type of Nipple: Everted at rest and after stimulation  Comfort (Breast/Nipple): Soft / non-tender     Hold (Positioning): No assistance needed to correctly position infant at breast. Intervention(s): Breastfeeding basics reviewed;Support Pillows;Position options;Skin to skin  LATCH Score: 10 (recent LATCH assessment by RN - experienced mom)  Lactation Tools Discussed/Used   STS, cue feedings, hand expression  Consult Status Date: 08/23/14 Follow-up type: In-patient    Victoria Russell [facility] 08/22/2014, 9:36 PM

## 2014-08-22 NOTE — Anesthesia Preprocedure Evaluation (Addendum)
Anesthesia Evaluation  Patient identified by MRN, date of birth, ID band Patient awake    Reviewed: Allergy & Precautions, H&P , NPO status , Patient's Chart, lab work & pertinent test results  Airway Mallampati: I  TM Distance: >3 FB Neck ROM: full    Dental no notable dental hx.    Pulmonary neg pulmonary ROS,  breath sounds clear to auscultation  Pulmonary exam normal       Cardiovascular negative cardio ROS      Neuro/Psych    GI/Hepatic negative GI ROS, Neg liver ROS,   Endo/Other  negative endocrine ROSHypothyroidism   Renal/GU negative Renal ROS     Musculoskeletal   Abdominal Normal abdominal exam  (+)   Peds  Hematology negative hematology ROS (+)   Anesthesia Other Findings   Reproductive/Obstetrics (+) Pregnancy                            Anesthesia Physical Anesthesia Plan  ASA: II  Anesthesia Plan: Spinal   Post-op Pain Management:    Induction:   Airway Management Planned:   Additional Equipment:   Intra-op Plan:   Post-operative Plan:   Informed Consent: I have reviewed the patients History and Physical, chart, labs and discussed the procedure including the risks, benefits and alternatives for the proposed anesthesia with the patient or authorized representative who has indicated his/her understanding and acceptance.     Plan Discussed with: CRNA, Anesthesiologist and Surgeon  Anesthesia Plan Comments:         Anesthesia Quick Evaluation

## 2014-08-22 NOTE — Anesthesia Postprocedure Evaluation (Signed)
Anesthesia Post Note  Patient: Victoria Russell  Procedure(s) Performed: Procedure(s) (LRB): CESAREAN SECTION (N/A)  Anesthesia type: Spinal  Patient location: Mother/Baby  Post pain: Pain level controlled  Post assessment: Post-op Vital signs reviewed  Last Vitals:  Filed Vitals:   08/22/14 1603  BP: 107/62  Pulse: 66  Temp: 36.7 C  Resp: 17    Post vital signs: Reviewed  Level of consciousness: awake  Complications: No apparent anesthesia complications

## 2014-08-22 NOTE — Anesthesia Procedure Notes (Signed)
Spinal Patient location during procedure: OR Start time: 08/22/2014 7:35 AM Staffing Anesthesiologist: Rudean Curt Performed by: anesthesiologist  Preanesthetic Checklist Completed: patient identified, site marked, surgical consent, pre-op evaluation, timeout performed, IV checked, risks and benefits discussed and monitors and equipment checked Spinal Block Patient position: sitting Prep: DuraPrep Patient monitoring: heart rate, cardiac monitor, continuous pulse ox and blood pressure Approach: midline Location: L3-4 Injection technique: single-shot Needle Needle type: Sprotte  Needle gauge: 24 G Needle length: 9 cm Assessment Sensory level: T4 Additional Notes Patient identified.  Risk benefits discussed including failed block, incomplete pain control, headache, nerve damage, paralysis, blood pressure changes, nausea, vomiting, reactions to medication both toxic or allergic, and postpartum back pain.  Patient expressed understanding and wished to proceed.  All questions were answered.  Sterile technique used throughout procedure.  CSF was clear.  No parasthesia or other complications.  Please see nursing notes for vital signs.

## 2014-08-22 NOTE — Plan of Care (Signed)
Problem: Phase I Progression Outcomes Goal: Pain controlled with appropriate interventions Outcome: Completed/Met Date Met:  08/22/14 Goal: Foley catheter patent Outcome: Completed/Met Date Met:  08/22/14 Goal: OOB as tolerated unless otherwise ordered Outcome: Completed/Met Date Met:  08/22/14 Goal: IS, TCDB as ordered Outcome: Completed/Met Date Met:  08/22/14 Goal: VS, stable, temp < 100.4 degrees F Outcome: Completed/Met Date Met:  08/22/14 Goal: Initial discharge plan identified Outcome: Completed/Met Date Met:  08/22/14 Goal: Other Phase I Outcomes/Goals Outcome: Not Applicable Date Met:  38/17/71  Problem: Phase II Progression Outcomes Goal: Pain controlled on oral analgesia Outcome: Completed/Met Date Met:  08/22/14 Goal: Progress activity as tolerated unless otherwise ordered Outcome: Completed/Met Date Met:  08/22/14 Goal: Afebrile, VS remain stable Outcome: Completed/Met Date Met:  08/22/14 Goal: Incision intact & without signs/symptoms of infection Outcome: Completed/Met Date Met:  08/22/14 Goal: Tolerating diet Outcome: Completed/Met Date Met:  08/22/14

## 2014-08-22 NOTE — Brief Op Note (Signed)
08/22/2014  8:15 AM  PATIENT:  Victoria Russell  28 y.o. female  PRE-OPERATIVE DIAGNOSIS:  IUP at 6 w 0 days  POST-OPERATIVE DIAGNOSIS:  same  PROCEDURE:  Procedure(s) with comments: CESAREAN SECTION (N/A) - Repeat edc 08/29/14  SURGEON:  Surgeon(s) and Role:    * Cyril Mourning, MD - Primary  PHYSICIAN ASSISTANT:   ASSISTANTS: none   ANESTHESIA:  spinal  EBL:  Total I/O In: 1000 [I.V.:1000] Out: 900 [Urine:300; Blood:600]  BLOOD ADMINISTERED:none  DRAINS: Urinary Catheter (Foley)   LOCAL MEDICATIONS USED:  NONE  SPECIMEN:  No Specimen  DISPOSITION OF SPECIMEN:  N/A  COUNTS:  YES  TOURNIQUET:  * No tourniquets in log *  DICTATION: .Other Dictation: Dictation Number [id number]  PLAN OF CARE: Admit to inpatient   PATIENT DISPOSITION:  PACU - hemodynamically stable.   Delay start of Pharmacological VTE agent (>24hrs) due to surgical blood loss or risk of bleeding: not applicable

## 2014-08-22 NOTE — Anesthesia Postprocedure Evaluation (Signed)
Anesthesia Post Note  Patient: Victoria Russell  Procedure(s) Performed: Procedure(s) (LRB): CESAREAN SECTION (N/A)  Anesthesia type: Spinal  Patient location: PACU  Post pain: Pain level controlled  Post assessment: Post-op Vital signs reviewed  Last Vitals:  Filed Vitals:   08/22/14 0930  BP: 114/69  Pulse: 63  Temp:   Resp: 15    Post vital signs: Reviewed  Level of consciousness: awake  Complications: No apparent anesthesia complications

## 2014-08-22 NOTE — Addendum Note (Signed)
Addendum  created 08/22/14 1649 by Flossie Dibble, CRNA   Modules edited: Notes Section   Notes Section:  File: [id number]

## 2014-08-23 ENCOUNTER — Encounter (HOSPITAL_COMMUNITY): Payer: Self-pay | Admitting: Obstetrics and Gynecology

## 2014-08-23 LAB — CBC
HCT: 30 % — ABNORMAL LOW (ref 36.0–46.0)
Hemoglobin: 9.6 g/dL — ABNORMAL LOW (ref 12.0–15.0)
MCH: 24.6 pg — AB (ref 26.0–34.0)
MCHC: 32 g/dL (ref 30.0–36.0)
MCV: 76.7 fL — AB (ref 78.0–100.0)
Platelets: 182 10*3/uL (ref 150–400)
RBC: 3.91 MIL/uL (ref 3.87–5.11)
RDW: 15.8 % — ABNORMAL HIGH (ref 11.5–15.5)
WBC: 15.4 10*3/uL — ABNORMAL HIGH (ref 4.0–10.5)

## 2014-08-23 NOTE — Plan of Care (Signed)
Problem: Discharge Progression Outcomes Goal: Activity appropriate for discharge plan Outcome: Completed/Met Date Met:  08/23/14 Goal: Tolerating diet Outcome: Completed/Met Date Met:  08/23/14 Goal: Pain controlled with appropriate interventions Outcome: Completed/Met Date Met:  08/23/14 Goal: Remove staples per MD order Outcome: Not Applicable Date Met:  11/06/41

## 2014-08-23 NOTE — Plan of Care (Signed)
Problem: Phase I Progression Outcomes Goal: Voiding adequately Outcome: Completed/Met Date Met:  08/23/14     

## 2014-08-23 NOTE — Plan of Care (Signed)
Problem: Phase II Progression Outcomes Goal: Other Phase II Outcomes/Goals Outcome: Not Applicable Date Met:  08/23/14     

## 2014-08-23 NOTE — Progress Notes (Signed)
Subjective: Postpartum Day 1: Cesarean Delivery Patient reports tolerating PO and no problems voiding.    Objective: Vital signs in last 24 hours: Temp:  [97.3 F (36.3 C)-98.4 F (36.9 C)] 98 F (36.7 C) (11/17 0410) Pulse Rate:  [53-89] 58 (11/17 0410) Resp:  [12-22] 18 (11/17 0410) BP: (85-120)/(48-77) 102/75 mmHg (11/17 0410) SpO2:  [96 %-100 %] 97 % (11/17 0410)  Physical Exam:  General: alert and cooperative Lochia: appropriate Uterine Fundus: firm Incision: healing well DVT Evaluation: No evidence of DVT seen on physical exam. Negative Homan's sign. No cords or calf tenderness. No significant calf/ankle edema.   Recent Labs  08/23/14 0630  HGB 9.6*  HCT 30.0*    Assessment/Plan: Status post Cesarean section. Doing well postoperatively.  Continue current care.  Ellamae Lybeck G 08/23/2014, 8:07 AM

## 2014-08-24 MED ORDER — OXYCODONE-ACETAMINOPHEN 5-325 MG PO TABS: 1.0000 | ORAL_TABLET | ORAL | Status: DC | PRN

## 2014-08-24 MED ORDER — IBUPROFEN 600 MG PO TABS: 600.0000 mg | ORAL_TABLET | Freq: Four times a day (QID) | ORAL | Status: DC

## 2014-08-24 NOTE — Discharge Summary (Signed)
Obstetric Discharge Summary Reason for Admission: cesarean section Prenatal Procedures: ultrasound Intrapartum Procedures: cesarean: low cervical, transverse Postpartum Procedures: none Complications-Operative and Postpartum: none HEMOGLOBIN  Date Value Ref Range Status  08/23/2014 9.6* 12.0 - 15.0 g/dL Final   HCT  Date Value Ref Range Status  08/23/2014 30.0* 36.0 - 46.0 % Final    Physical Exam:  General: alert and cooperative Lochia: appropriate Uterine Fundus: firm Incision: healing well DVT Evaluation: No evidence of DVT seen on physical exam. Negative Homan's sign. No cords or calf tenderness. No significant calf/ankle edema.  Discharge Diagnoses: Term Pregnancy-delivered  Discharge Information: Date: 08/24/2014 Activity: pelvic rest Diet: routine Medications: PNV, Ibuprofen, Percocet and synthroid Condition: stable Instructions: refer to practice specific booklet Discharge to: home   Newborn Data: Live born female  Birth Weight: 7 lb 13 oz (3544 g) APGAR: 9, 9  Home with mother.  CURTIS,CAROL G 08/24/2014, 8:07 AM

## 2014-08-24 NOTE — Lactation Note (Signed)
This note was copied from the chart of Victoria Russell. Lactation Consultation Note  Mother denies questions or problems. Reviewed engorgement care and monitoring voids/stools. Suggest she call if she needs further assistance.  Patient Name: Victoria Russell Date: 08/24/2014 Reason for consult: Follow-up assessment   Maternal Data    Feeding Feeding Type: Breast Fed Length of feed: 15 min  LATCH Score/Interventions                      Lactation Tools Discussed/Used     Consult Status Consult Status: Complete    Carlye Grippe 08/24/2014, 11:15 AM

## 2014-08-24 NOTE — Plan of Care (Signed)
Problem: Consults Goal: Postpartum Patient Education (See Patient Education module for education specifics.)  Outcome: Completed/Met Date Met:  08/24/14

## 2014-09-10 ENCOUNTER — Emergency Department (HOSPITAL_COMMUNITY): Payer: 59

## 2014-09-10 ENCOUNTER — Encounter (HOSPITAL_COMMUNITY): Payer: Self-pay

## 2014-09-10 ENCOUNTER — Emergency Department (HOSPITAL_COMMUNITY)
Admission: EM | Admit: 2014-09-10 | Discharge: 2014-09-10 | Disposition: A | Discharge status: Home / Self Care | Payer: 59 | Attending: Emergency Medicine | Admitting: Emergency Medicine

## 2014-09-10 DIAGNOSIS — K5732 Diverticulitis of large intestine without perforation or abscess without bleeding: Secondary | ICD-10-CM

## 2014-09-10 DIAGNOSIS — Z8619 Personal history of other infectious and parasitic diseases: Secondary | ICD-10-CM | POA: Insufficient documentation

## 2014-09-10 DIAGNOSIS — Z8679 Personal history of other diseases of the circulatory system: Secondary | ICD-10-CM | POA: Insufficient documentation

## 2014-09-10 DIAGNOSIS — R109 Unspecified abdominal pain: Secondary | ICD-10-CM

## 2014-09-10 DIAGNOSIS — Z79899 Other long term (current) drug therapy: Secondary | ICD-10-CM | POA: Insufficient documentation

## 2014-09-10 DIAGNOSIS — Z8659 Personal history of other mental and behavioral disorders: Secondary | ICD-10-CM | POA: Diagnosis not present

## 2014-09-10 DIAGNOSIS — Z86018 Personal history of other benign neoplasm: Secondary | ICD-10-CM | POA: Diagnosis not present

## 2014-09-10 DIAGNOSIS — Z87442 Personal history of urinary calculi: Secondary | ICD-10-CM | POA: Insufficient documentation

## 2014-09-10 DIAGNOSIS — Z8742 Personal history of other diseases of the female genital tract: Secondary | ICD-10-CM | POA: Diagnosis not present

## 2014-09-10 DIAGNOSIS — Z9889 Other specified postprocedural states: Secondary | ICD-10-CM | POA: Insufficient documentation

## 2014-09-10 DIAGNOSIS — Z8639 Personal history of other endocrine, nutritional and metabolic disease: Secondary | ICD-10-CM | POA: Diagnosis not present

## 2014-09-10 DIAGNOSIS — Z872 Personal history of diseases of the skin and subcutaneous tissue: Secondary | ICD-10-CM | POA: Insufficient documentation

## 2014-09-10 DIAGNOSIS — R10A1 Flank pain, right side: Secondary | ICD-10-CM

## 2014-09-10 LAB — URINALYSIS, ROUTINE W REFLEX MICROSCOPIC
Glucose, UA: NEGATIVE mg/dL
Ketones, ur: 15 mg/dL — AB
Nitrite: NEGATIVE
Protein, ur: NEGATIVE mg/dL
SPECIFIC GRAVITY, URINE: 1.022 (ref 1.005–1.030)
Urobilinogen, UA: 0.2 mg/dL (ref 0.0–1.0)
pH: 6 (ref 5.0–8.0)

## 2014-09-10 LAB — URINE MICROSCOPIC-ADD ON

## 2014-09-10 LAB — CBC WITH DIFFERENTIAL/PLATELET
Basophils Absolute: 0.1 10*3/uL (ref 0.0–0.1)
Basophils Relative: 1 % (ref 0–1)
Eosinophils Absolute: 0.2 10*3/uL (ref 0.0–0.7)
Eosinophils Relative: 3 % (ref 0–5)
HCT: 38.9 % (ref 36.0–46.0)
Hemoglobin: 12 g/dL (ref 12.0–15.0)
Lymphocytes Relative: 23 % (ref 12–46)
Lymphs Abs: 2 10*3/uL (ref 0.7–4.0)
MCH: 24 pg — ABNORMAL LOW (ref 26.0–34.0)
MCHC: 30.8 g/dL (ref 30.0–36.0)
MCV: 78 fL (ref 78.0–100.0)
MONOS PCT: 7 % (ref 3–12)
Monocytes Absolute: 0.6 10*3/uL (ref 0.1–1.0)
NEUTROS ABS: 5.8 10*3/uL (ref 1.7–7.7)
NEUTROS PCT: 66 % (ref 43–77)
Platelets: 329 10*3/uL (ref 150–400)
RBC: 4.99 MIL/uL (ref 3.87–5.11)
RDW: 16.1 % — ABNORMAL HIGH (ref 11.5–15.5)
WBC: 8.6 10*3/uL (ref 4.0–10.5)

## 2014-09-10 LAB — BASIC METABOLIC PANEL
Anion gap: 18 — ABNORMAL HIGH (ref 5–15)
BUN: 13 mg/dL (ref 6–23)
CHLORIDE: 102 meq/L (ref 96–112)
CO2: 19 meq/L (ref 19–32)
Calcium: 9.6 mg/dL (ref 8.4–10.5)
Creatinine, Ser: 0.79 mg/dL (ref 0.50–1.10)
GFR calc Af Amer: >90 mL/min (ref >90)
GFR calc non Af Amer: >90 mL/min (ref >90)
GLUCOSE: 82 mg/dL (ref 70–99)
POTASSIUM: 4.4 meq/L (ref 3.7–5.3)
Sodium: 139 mEq/L (ref 137–147)

## 2014-09-10 MED ORDER — SODIUM CHLORIDE 0.9 % IV SOLN
1.5000 g | Freq: Once | INTRAVENOUS | Status: AC
  Administered 2014-09-10: 1.5 g via INTRAVENOUS
  Filled 2014-09-10: qty 1.5

## 2014-09-10 MED ORDER — HYDROCODONE-ACETAMINOPHEN 5-325 MG PO TABS: 1.0000 | ORAL_TABLET | ORAL | Status: DC | PRN

## 2014-09-10 MED ORDER — SODIUM CHLORIDE 0.9 % IV SOLN
INTRAVENOUS | Status: AC
  Filled 2014-09-10: qty 3

## 2014-09-10 MED ORDER — AMOXICILLIN-POT CLAVULANATE 875-125 MG PO TABS: 1.0000 | ORAL_TABLET | Freq: Two times a day (BID) | ORAL | Status: DC

## 2014-09-10 NOTE — ED Notes (Signed)
Pt c/o R side flank pain and increased urinary frequency w/ decreased output.  Pain score 8/10.  Pt has c section x 3 weeks ago.  Hx of kidney stones.  Pt took prescribed pain medication w/ some relief.

## 2014-09-10 NOTE — ED Notes (Signed)
She states she took her prescribed pain med (from recent c-section); and she tells me happily that "It is working fine-I don't need anything else for pain right now".

## 2014-09-10 NOTE — Discharge Instructions (Signed)

## 2014-09-10 NOTE — ED Notes (Signed)
I have just visualized her Pfannensteil-type lower abd. Incision; and note the presence of steri strips.  The incision is without any drainage or discoloration, nor any problem of any kind.

## 2014-09-10 NOTE — ED Provider Notes (Signed)
CSN: [id number]     Arrival date & time 09/10/14  1148 History   First MD Initiated Contact with Patient 09/10/14 1159     Chief Complaint  Patient presents with  . Flank Pain     (Consider location/radiation/quality/duration/timing/severity/associated sxs/prior Treatment) HPI Comments: Patient presents to the ER for evaluation of right flank pain. Patient reports that she started to have some discomfort in the right lower back last night, but today the pain has significantly worsened. She reports that she is having stabbing pains in the right flank area that seem consistent with previous kidney stones that she has had. She did, however, have a cesarean section delivery November 16. Patient reports that she had follow-up in the office this week and was told that everything seemed fine postoperatively. She has not had any fever. There is no nausea or vomiting. She does report dysuria and urinary frequency.  Patient is a 28 y.o. female presenting with flank pain.  Flank Pain    Past Medical History  Diagnosis Date  . ADHD (attention deficit hyperactivity disorder)   . Hypothyroidism   . Hyperhidrosis   . WPW (Wolff-Parkinson-White syndrome)   . Atypical moles     dysplasia  . History of kidney stones     2012, passed stones, no surgery required  . Right ureteral stone 3/09  . Dysmenorrhea   . HLD (hyperlipidemia)     diet controlled  . Hx of varicella   . Wolff-Parkinson-White (WPW) syndrome     history, no problems since age 72 yrs old  . Pregnancy induced hypertension     resolved with delivery of 01/2013 pregnancy  . Anxiety     no meds   Past Surgical History  Procedure Laterality Date  . Tonsillectomy and adenoidectomy    . Myringotomy    . Mole removal      dysplastic x 2 - right breast and right glut  . Cystoscopy    . Cesarean section N/A 01/29/2013    Procedure: CESAREAN SECTION;  Surgeon: Cyril Mourning, MD;  Location: [facility];  Service: Obstetrics;  Laterality:  N/A;  . Wisdom tooth extraction    . Cesarean section N/A 08/22/2014    Procedure: CESAREAN SECTION;  Surgeon: Cyril Mourning, MD;  Location: [facility];  Service: Obstetrics;  Laterality: N/A;  Repeat edc 08/29/14   Family History  Problem Relation Age of Onset  . [place] White syndrome Mother   . Hypothyroidism Mother   . Basal cell carcinoma Mother   . Cancer Mother     basal cell skin CA  . Diabetes Maternal Grandfather   . Cervical cancer      MGGM  . Cervical cancer Maternal Grandmother   . Cancer Maternal Grandmother     cervical CA  . Coronary artery disease Paternal Grandmother   . Heart disease Paternal Grandmother     CAD  . Other Sister     Dermoid cyst  . Cancer Other     cervical CA  . Depression Father    History  Substance Use Topics  . Smoking status: Never Smoker   . Smokeless tobacco: Never Used  . Alcohol Use: No     Comment: Social   OB History    Gravida Para Term Preterm AB TAB SAB Ectopic Multiple Living   2 2 2       0 2     Review of Systems  Genitourinary: Positive for dysuria, frequency and flank pain.  All other systems reviewed and are negative.     Allergies  Review of patient's allergies indicates no known allergies.  Home Medications   Prior to Admission medications   Medication Sig Start Date End Date Taking? Authorizing Provider  flintstones complete (FLINTSTONES) 60 MG chewable tablet Chew 2 tablets by mouth daily.    Historical Provider, MD  ibuprofen (ADVIL,MOTRIN) 600 MG tablet Take 1 tablet (600 mg total) by mouth every 6 (six) hours. 08/24/14   Damien Fusi, NP  oxyCODONE-acetaminophen (PERCOCET/ROXICET) 5-325 MG per tablet Take 1 tablet by mouth every 4 (four) hours as needed (for pain scale less than 7). 08/24/14   Damien Fusi, NP   BP 125/80 mmHg  Pulse 93  Temp(Src) 98.3 F (36.8 C) (Oral)  Resp 16  SpO2 98%  LMP 11/22/2013 Physical Exam  Constitutional: She is oriented to person, place, and  time. She appears well-developed and well-nourished. No distress.  HENT:  Head: Normocephalic and atraumatic.  Right Ear: Hearing normal.  Left Ear: Hearing normal.  Nose: Nose normal.  Mouth/Throat: Oropharynx is clear and moist and mucous membranes are normal.  Eyes: Conjunctivae and EOM are normal. Pupils are equal, round, and reactive to light.  Neck: Normal range of motion. Neck supple.  Cardiovascular: Regular rhythm, S1 normal and S2 normal.  Exam reveals no gallop and no friction rub.   No murmur heard. Pulmonary/Chest: Effort normal and breath sounds normal. No respiratory distress. She exhibits no tenderness.  Abdominal: Soft. Normal appearance and bowel sounds are normal. There is no hepatosplenomegaly. There is no tenderness. There is no rebound, no guarding, no tenderness at McBurney's point and negative Murphy's sign. No hernia.  Musculoskeletal: Normal range of motion.  Neurological: She is alert and oriented to person, place, and time. She has normal strength. No cranial nerve deficit or sensory deficit. Coordination normal. GCS eye subscore is 4. GCS verbal subscore is 5. GCS motor subscore is 6.  Skin: Skin is warm, dry and intact. No rash noted. No cyanosis.  Psychiatric: She has a normal mood and affect. Her speech is normal and behavior is normal. Thought content normal.  Nursing note and vitals reviewed.   ED Course  Procedures (including critical care time) Labs Review Labs Reviewed  CBC WITH DIFFERENTIAL  BASIC METABOLIC PANEL  URINALYSIS, ROUTINE W REFLEX MICROSCOPIC    Imaging Review No results found.   EKG Interpretation None      MDM   Final diagnoses:  Right flank pain   Patient presents to the ER for evaluation of right flank pain. Patient reports a previous history of kidney stones in the pain does feel similar. She did, however, have a C-section 3 weeks ago. Abdominal wall does not have any concerning findings for infection. She did have  follow-up with OB/GYN this week and was told that she was fine from a postoperative standpoint. Lab work was unremarkable. I opted to perform CT scan to confirm kidney stone and rule out postoperative problems such as abscess formation. Unexpected hepatic flexure diverticulitis was identified on the scan. Patient is tolerating by mouth without difficulty. She has not required any additional pain medicine here in the ER. She is appropriate for outpatient management.  Patient was admitted yesterday IV Unasyn here in the ER. She will be maintained as an outpatient with Augmentin. This treatment regimen was chosen because of concerns over Flagyl use in lactation reported in literature. Patient was told if she is not improving she would need to  be seen again. At that time consideration for Cipro and Flagyl would be entertained.  Patient also had a calcification in the right pelvis that could be a distal ureteral stone. It was not clear based on the scan. Patient is not having any significant pain currently. She has not required any additional analgesia here in the ER. She will be provided Vicodin. She understands that she should not breast-feed immediately after using Vicodin.    Orpah Greek, MD 09/11/14 [phone]

## 2014-09-10 NOTE — ED Notes (Signed)
Patient's aware that we need a urine specimen.

## 2014-09-12 ENCOUNTER — Encounter: Payer: Self-pay | Admitting: Internal Medicine

## 2014-09-12 ENCOUNTER — Telehealth: Payer: Self-pay | Admitting: Family Medicine

## 2014-09-12 DIAGNOSIS — K5732 Diverticulitis of large intestine without perforation or abscess without bleeding: Secondary | ICD-10-CM | POA: Insufficient documentation

## 2014-09-12 NOTE — Telephone Encounter (Signed)
I will ref to gi for diverticulitis/abd pain

## 2014-09-12 NOTE — Telephone Encounter (Signed)
Patient called to let you know that last Friday she was having right  Flank pain, severe stomach cramps for 2 days and finally went to [facility] ER. They did a CT scan and found a kidney stone and diverticulitis. The stone was already low and she feels like she already passed it but the Dr at the ER told her she should see a Copywriter, advertising to make sure she doesn't have Crohns.. She is on Amoxicillan right now and is feeling better and wants to know if you can just put the referral in or do you need to see her in the office. She just had her new daughter 3 weeks ago so things are a bit hectic so if referral can be placed it would help her out a lot. She is also breast feeding. Please advise. Call her at home# [phone].

## 2014-09-14 ENCOUNTER — Ambulatory Visit: Payer: 59 | Admitting: Gastroenterology

## 2014-10-21 ENCOUNTER — Other Ambulatory Visit: Payer: Self-pay | Admitting: Obstetrics and Gynecology

## 2014-10-25 LAB — CYTOLOGY - PAP

## 2014-11-02 ENCOUNTER — Encounter: Payer: Self-pay | Admitting: Internal Medicine

## 2014-11-02 ENCOUNTER — Ambulatory Visit (INDEPENDENT_AMBULATORY_CARE_PROVIDER_SITE_OTHER): Payer: 59 | Admitting: Internal Medicine

## 2014-11-02 VITALS — BP 116/72 | HR 60 | Ht 64.0 in | Wt 154.0 lb

## 2014-11-02 DIAGNOSIS — R194 Change in bowel habit: Secondary | ICD-10-CM

## 2014-11-02 DIAGNOSIS — R1030 Lower abdominal pain, unspecified: Secondary | ICD-10-CM

## 2014-11-02 DIAGNOSIS — K625 Hemorrhage of anus and rectum: Secondary | ICD-10-CM

## 2014-11-02 DIAGNOSIS — K5732 Diverticulitis of large intestine without perforation or abscess without bleeding: Secondary | ICD-10-CM

## 2014-11-02 DIAGNOSIS — R935 Abnormal findings on diagnostic imaging of other abdominal regions, including retroperitoneum: Secondary | ICD-10-CM

## 2014-11-02 MED ORDER — MOVIPREP 100 G PO SOLR: 1.0000 | Freq: Once | ORAL | Status: DC

## 2014-11-02 NOTE — Patient Instructions (Signed)

## 2014-11-02 NOTE — Progress Notes (Signed)
HISTORY OF PRESENT ILLNESS:  Victoria Russell is a 29 y.o. female with the below listed medical history who is sent today upon referral from the was a long emergency room attending physician regarding recently diagnosed diverticulitis. The patient has a history of kidney stones. She presented to [facility] 09/10/2014 with right-sided flank pain, increased urinary frequency, and decreased urinary output. She underwent a noncontrast enhanced CT scan of the abdomen and pelvis. She was found to have a right UVJ stone with mild hydronephrosis. In addition, she was said to have an area of acute diverticulitis in the region of the hepatic flexure without complicating features. In addition small volume ascites. This all occurred 2-1/2 weeks after cesarean section. She was treated with antibiotics and the pain resolved. Upon questioning, the patient felt like the discomfort was different than her kidney stone pain. She reports more discomfort in the lower abdomen.. Currently asymptomatic though she reports being "careful" with her diet avoiding nuts and seeds and the like. She has had significant weight loss since her delivery, though still above her baseline body weight. She does have occasional alternating bowel habits and minor rectal bleeding. Her parents, the Mancel Bale, are patients of mine  REVIEW OF SYSTEMS:  All non-GI ROS negative except for hematuria, urinary frequency  Past Medical History  Diagnosis Date  . ADHD (attention deficit hyperactivity disorder)   . Hypothyroidism   . Hyperhidrosis   . Atypical moles     dysplasia  . History of kidney stones     2012, passed stones, no surgery required  . Right ureteral stone 3/09  . Dysmenorrhea   . HLD (hyperlipidemia)     diet controlled  . Hx of varicella   . Wolff-Parkinson-White (WPW) syndrome     history, no problems since age 77 yrs old  . Pregnancy induced hypertension     resolved with delivery of 01/2013 pregnancy  . Anxiety      no meds  . Diverticulitis     Past Surgical History  Procedure Laterality Date  . Tonsillectomy and adenoidectomy    . Myringotomy    . Mole removal      dysplastic x 2 - right breast and right glut  . Cystoscopy    . Cesarean section N/A 01/29/2013    Procedure: CESAREAN SECTION;  Surgeon: Cyril Mourning, MD;  Location: [facility];  Service: Obstetrics;  Laterality: N/A;  . Wisdom tooth extraction    . Cesarean section N/A 08/22/2014    Procedure: CESAREAN SECTION;  Surgeon: Cyril Mourning, MD;  Location: [facility];  Service: Obstetrics;  Laterality: N/A;  Repeat edc 08/29/14    Social History BRITTON PERKINSON  reports that she has never smoked. She has never used smokeless tobacco. She reports that she does not drink alcohol or use illicit drugs.  family history includes Basal cell carcinoma in her mother; Cancer in her maternal grandmother, mother, and other; Cervical cancer in her maternal grandmother and another family member; Colon polyps in her father and mother; Coronary artery disease in her paternal grandmother; Depression in her father; Diabetes in her daughter and maternal grandfather; Heart disease in her paternal grandmother; Hypothyroidism in her mother; Other in her sister; Wolff Parkinson White syndrome in her mother.  No Known Allergies     PHYSICAL EXAMINATION:  Vital signs: BP 116/72 mmHg  Pulse 60  Ht 5\' 4"  (1.626 m)  Wt 154 lb (69.854 kg)  BMI 26.42 kg/m2  LMP 10/23/2014 (Exact Date)  Breastfeeding? No  Constitutional: generally well-appearing, no acute distress Psychiatric: alert and oriented x3, cooperative Eyes: extraocular movements intact, anicteric, conjunctiva pink Mouth: oral pharynx moist, no lesions Neck: supple no lymphadenopathy Cardiovascular: heart regular rate and rhythm, no murmur Lungs: clear to auscultation bilaterally Abdomen: soft, nontender, nondistended, no obvious ascites, no peritoneal signs, normal bowel sounds, no  organomegaly Rectal: Deferred until colonoscopy Extremities: no lower extremity edema bilaterally Skin: no lesions on visible extremities Neuro: No focal deficits.   ASSESSMENT:  #1. Recent evaluation for kidney stones with possible concurrent hepatic flexure diverticulitis. Asymptomatic post antibiotic therapy   PLAN:   #1. No significant additional diverticula noted on scan. It is not on her, but a bit unusual for diverticulitis in this age. I do feel that she needs some follow-up to reevaluate that area of the colon to assess for resolution. We discussed options including MRI of the abdomen or direct evaluation with colonoscopy. She favors the latter should the need for biopsy or other intervention be required to clarify any abnormalities. Thus, we will proceed with colonoscopy. Also, we can evaluate complaints of alternating bowel habits and minor intermittent rectal bleeding.The nature of the procedure, as well as the risks, benefits, and alternatives were carefully and thoroughly reviewed with the patient. Ample time for discussion and questions allowed. The patient understood, was satisfied, and agreed to proceed.

## 2014-11-09 ENCOUNTER — Ambulatory Visit (AMBULATORY_SURGERY_CENTER): Payer: 59 | Admitting: Internal Medicine

## 2014-11-09 ENCOUNTER — Encounter: Payer: Self-pay | Admitting: Internal Medicine

## 2014-11-09 VITALS — BP 109/58 | HR 45 | Temp 97.5°F | Resp 15 | Ht 64.0 in | Wt 154.0 lb

## 2014-11-09 DIAGNOSIS — R935 Abnormal findings on diagnostic imaging of other abdominal regions, including retroperitoneum: Secondary | ICD-10-CM

## 2014-11-09 DIAGNOSIS — K5732 Diverticulitis of large intestine without perforation or abscess without bleeding: Secondary | ICD-10-CM

## 2014-11-09 HISTORY — PX: COLONOSCOPY WITH PROPOFOL: SHX5780

## 2014-11-09 MED ORDER — SODIUM CHLORIDE 0.9 % IV SOLN: 500.0000 mL | INTRAVENOUS | Status: DC

## 2014-11-09 NOTE — Patient Instructions (Signed)
YOU HAD AN ENDOSCOPIC PROCEDURE TODAY AT THE [facility]: Refer to the procedure report that was given to you for any specific questions about what was found during the examination.  If the procedure report does not answer your questions, please call your gastroenterologist to clarify.  If you requested that your care partner not be given the details of your procedure findings, then the procedure report has been included in a sealed envelope for you to review at your convenience later.  YOU SHOULD EXPECT: Some feelings of bloating in the abdomen. Passage of more gas than usual.  Walking can help get rid of the air that was put into your GI tract during the procedure and reduce the bloating. If you had a lower endoscopy (such as a colonoscopy or flexible sigmoidoscopy) you may notice spotting of blood in your stool or on the toilet paper. If you underwent a bowel prep for your procedure, then you may not have a normal bowel movement for a few days.  DIET: Your first meal following the procedure should be a light meal and then it is ok to progress to your normal diet.  A half-sandwich or bowl of soup is an example of a good first meal.  Heavy or fried foods are harder to digest and may make you feel nauseous or bloated.  Likewise meals heavy in dairy and vegetables can cause extra gas to form and this can also increase the bloating.  Drink plenty of fluids but you should avoid alcoholic beverages for 24 hours.  ACTIVITY: Your care partner should take you home directly after the procedure.  You should plan to take it easy, moving slowly for the rest of the day.  You can resume normal activity the day after the procedure however you should NOT DRIVE or use heavy machinery for 24 hours (because of the sedation medicines used during the test).    SYMPTOMS TO REPORT IMMEDIATELY: A gastroenterologist can be reached at any hour.  During normal business hours, 8:30 AM to 5:00 PM Monday through Friday,  call [phone].  After hours and on weekends, please call the GI answering service at [phone] who will take a message and have the physician on call contact you.   Following lower endoscopy (colonoscopy or flexible sigmoidoscopy):  Excessive amounts of blood in the stool  Significant tenderness or worsening of abdominal pains  Swelling of the abdomen that is new, acute  Fever of 100F or higher  FOLLOW UP: If any biopsies were taken you will be contacted by phone or by letter within the next 1-3 weeks.  Call your gastroenterologist if you have not heard about the biopsies in 3 weeks.  Our staff will call the home number listed on your records the next business day following your procedure to check on you and address any questions or concerns that you may have at that time regarding the information given to you following your procedure. This is a courtesy call and so if there is no answer at the home number and we have not heard from you through the emergency physician on call, we will assume that you have returned to your regular daily activities without incident.  SIGNATURES/CONFIDENTIALITY: You and/or your care partner have signed paperwork which will be entered into your electronic medical record.  These signatures attest to the fact that that the information above on your After Visit Summary has been reviewed and is understood.  Full responsibility of the confidentiality of this   discharge information lies with you and/or your care-partner.  Resume medications. 

## 2014-11-09 NOTE — Op Note (Signed)
[place]  [address] [place], [id number]   COLONOSCOPY PROCEDURE REPORT  PATIENT: Victoria Russell, Victoria Russell  MR#: [id number] BIRTHDATE: Jul 21, 1986 , 28  yrs. old GENDER: female ENDOSCOPIST: Eustace Quail, MD REFERRED QJ:JHERD Vernell Morgans, M.D. PROCEDURE DATE:  11/09/2014 PROCEDURE:   Colonoscopy, diagnostic First Screening Colonoscopy - Avg.  risk and is 50 yrs.  old or older - No.  Prior Negative Screening - Now for repeat screening. N/A  History of Adenoma - Now for follow-up colonoscopy & has been > or = to 3 yrs.  N/A  Polyps Removed Today? No.  Recommend repeat exam, <10 yrs? No. ASA CLASS:   Class II INDICATIONS:an abnormal CT and abdominal pain.   ? hepatic flexure diverticulitis MEDICATIONS: Monitored anesthesia care and Propofol 400 mg IV  DESCRIPTION OF PROCEDURE:   After the risks benefits and alternatives of the procedure were thoroughly explained, informed consent was obtained.  The digital rectal exam revealed no abnormalities of the rectum.   The LB [id number]  endoscope was introduced through the anus and advanced to the cecum, which was identified by both the appendix and ileocecal valve. No adverse events experienced.   The quality of the prep was excellent, using MoviPrep  The instrument was then slowly withdrawn as the colon was fully examined.     COLON FINDINGS: The examined terminal ileum appeared to be normal. A normal appearing cecum, ileocecal valve, and appendiceal orifice were identified.  The ascending, transverse, descending, sigmoid colon, and rectum appeared unremarkable.  Retroflexed views revealed no abnormalities. The time to cecum=2 minutes 38 seconds. Withdrawal time=8 minutes 51 seconds.  The scope was withdrawn and the procedure completed.  COMPLICATIONS: There were no immediate complications.  ENDOSCOPIC IMPRESSION: 1.   The examined terminal ileum appeared to be normal 2.   Normal  colonoscopy  RECOMMENDATIONS: 1. Continue current colorectal screening recommendations for "routine risk" patients with a repeat colonoscopy at age 72.  eSigned:  Eustace Quail, MD 11/09/2014 8:33 AM   cc: The Patient and Abner Greenspan, MD

## 2014-11-09 NOTE — Progress Notes (Signed)
A/ox3 pleased with MAC, report to Sheila RN 

## 2014-11-10 ENCOUNTER — Telehealth: Payer: Self-pay | Admitting: *Deleted

## 2014-11-10 NOTE — Telephone Encounter (Signed)
Number identifier, left message, follow-up  

## 2014-11-30 ENCOUNTER — Other Ambulatory Visit: Payer: Self-pay | Admitting: Family Medicine

## 2015-01-25 IMAGING — CT CT RENAL STONE PROTOCOL
1 series · 13 of 20 positions shown, 18 images · non-contrast
Comparison: CT of the abdomen and pelvis 12/11/2007.

CLINICAL DATA: 28-year-old female presenting with [DATE]
right-sided flank pain, increased urinary frequency, but decreased
urinary output. Recent history of cyst Rudi section 3 weeks ago.
History of kidney stones.

EXAM:
CT ABDOMEN AND PELVIS WITHOUT CONTRAST
TECHNIQUE: Multidetector CT imaging of the abdomen and pelvis was performed
following the standard protocol without IV contrast.

[Series 4: lung · axial · 0.72mm/px · z∈[-122,-37]mm · 13 of 20 slices shown, 18 images]
[im 2/20  soft-tissue]
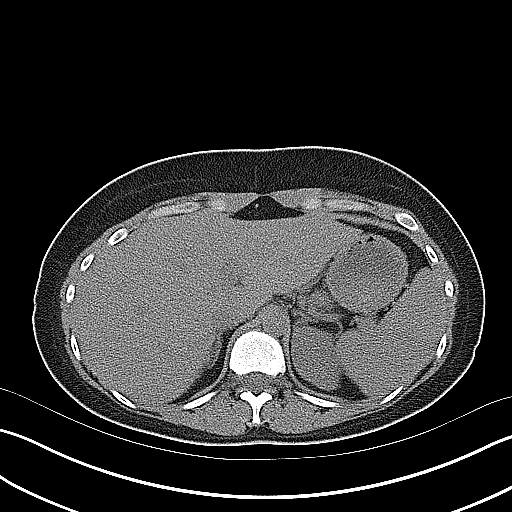
[im 2/20  bone]
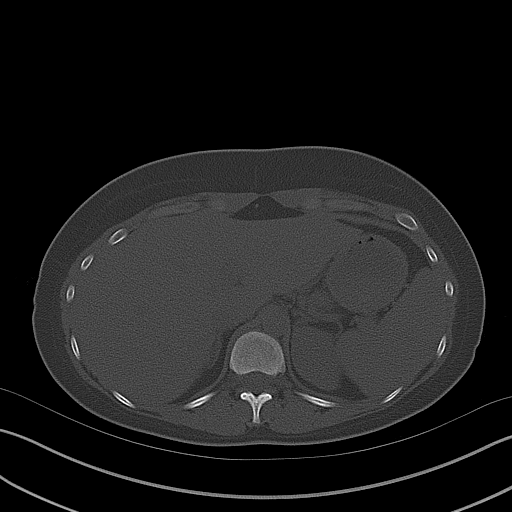
[im 4/20  soft-tissue]
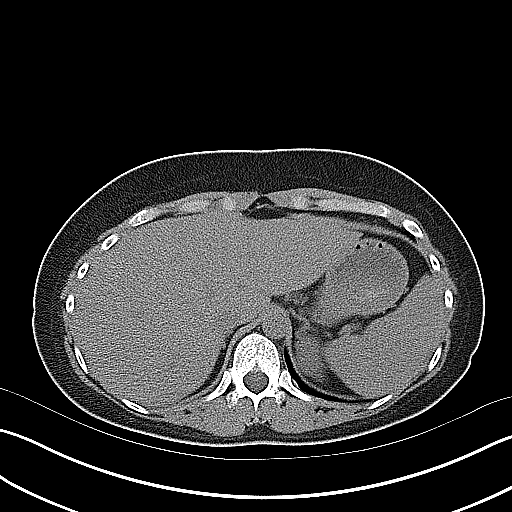
[im 5/20  soft-tissue]
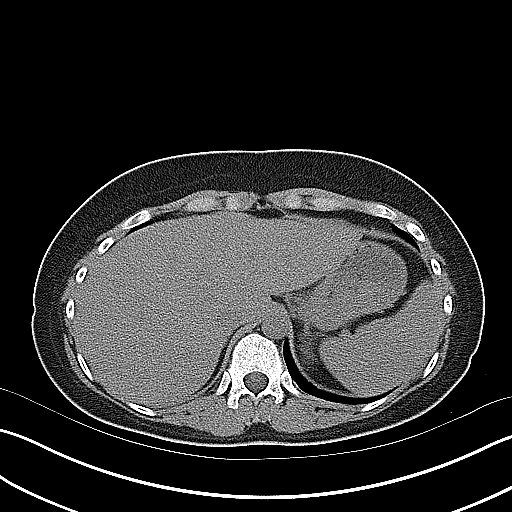
[im 7/20  soft-tissue]
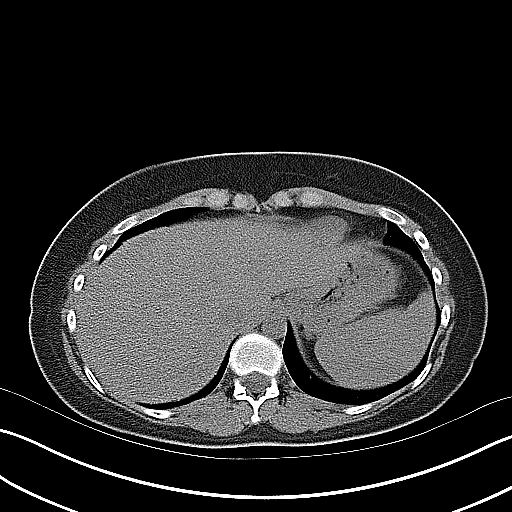
[im 8/20  soft-tissue]
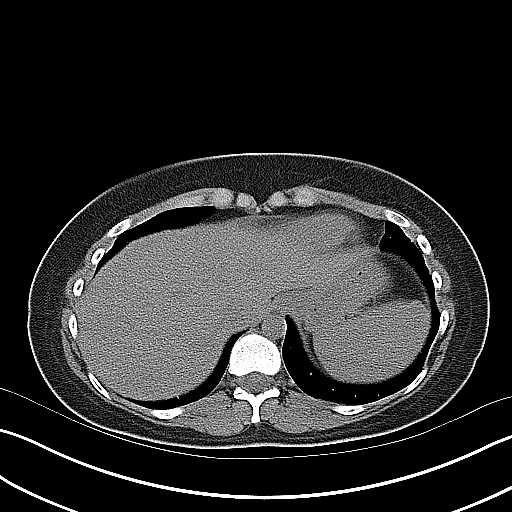
[im 10/20  soft-tissue]
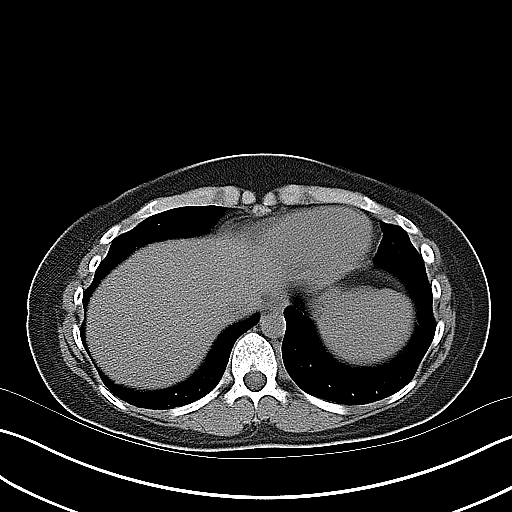
[im 11/20  soft-tissue]
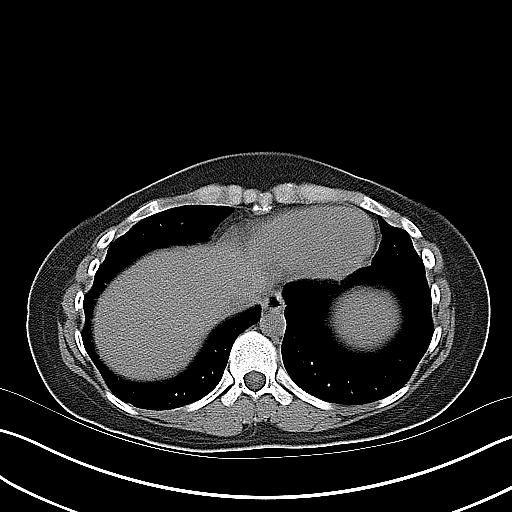
[im 13/20  soft-tissue]
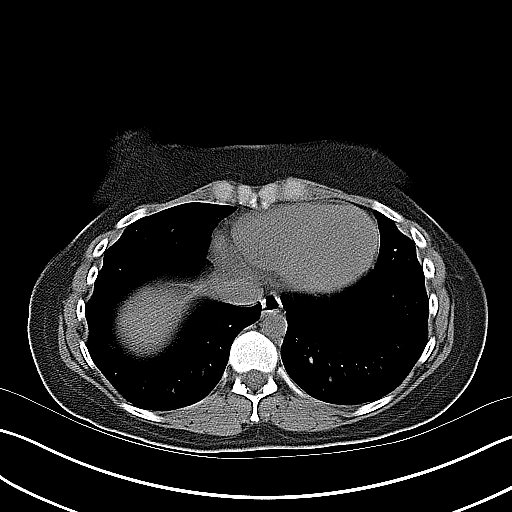
[im 14/20  soft-tissue]
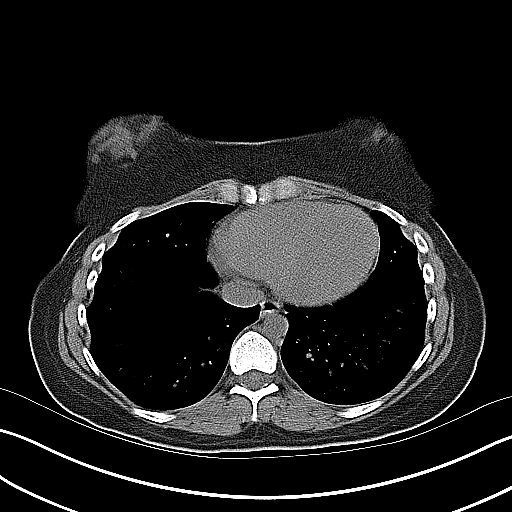
[im 14/20  bone]
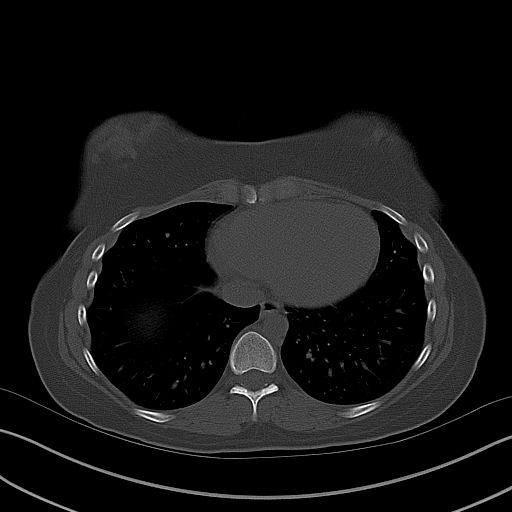
[im 16/20  soft-tissue]
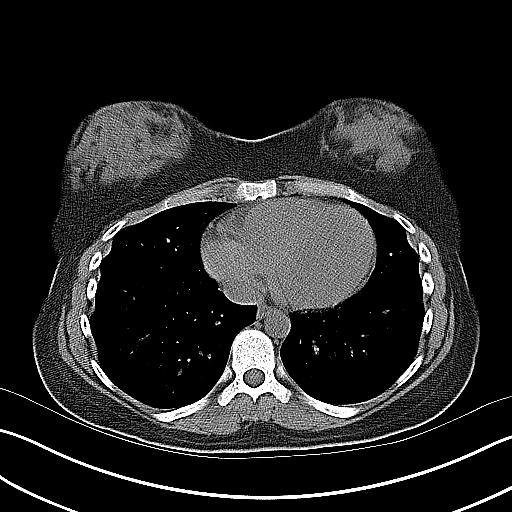
[im 16/20  lung]
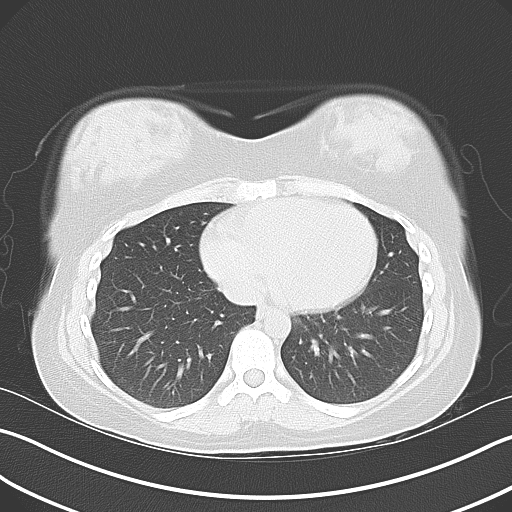
[im 17/20  soft-tissue]
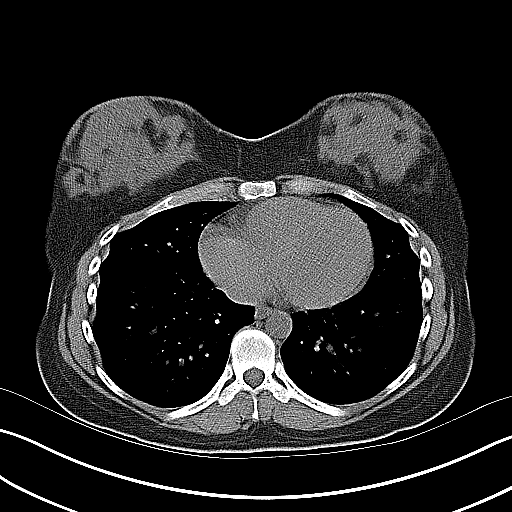
[im 17/20  lung]
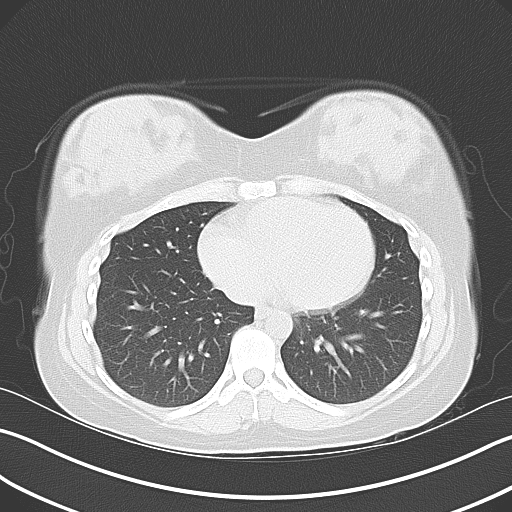
[im 18/20  lung]
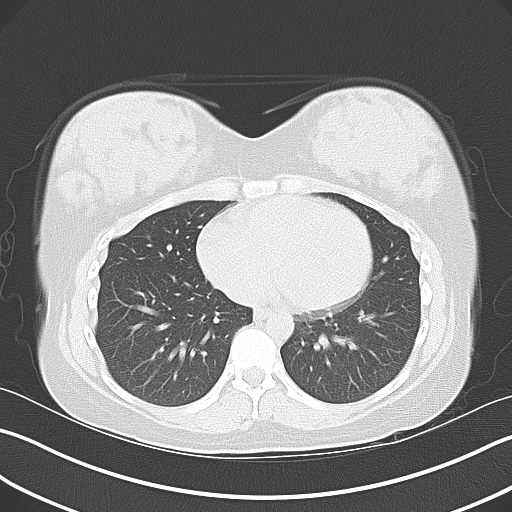
[im 19/20  soft-tissue]
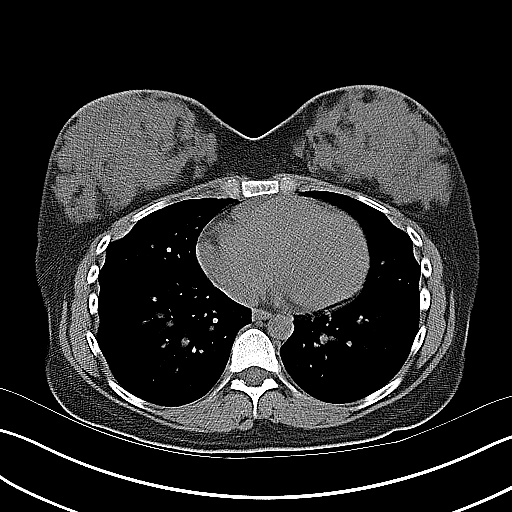
[im 19/20  lung]
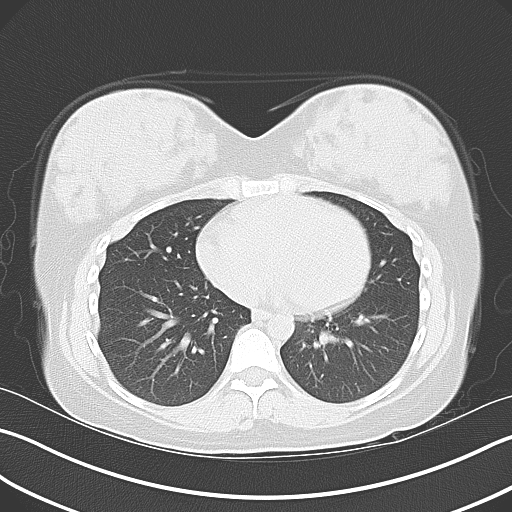

[13 of 20 positions shown; findings below may reference images not displayed]

FINDINGS: Lower chest:  Unremarkable.

Hepatobiliary: No focal hepatic lesion identified on today's non
contrast CT examination. Gallbladder is normal in appearance.

Pancreas: Unremarkable.

Spleen: Unremarkable.

Adrenals/Urinary Tract: Image 77 of series 2 demonstrates a 4 mm
calcification in the pelvis which is in close proximity to the right
ureterovesicular junction. Unfortunately, due to a paucity of fat in
this region it is difficult to say for certain whether or not this
is a distal ureteral stone, or simply a phlebolith. Notably,
however, there does appear to be some mild right-sided
hydroureteronephrosis, suggesting that this is likely a distal right
ureteral stone either at or immediately before the right
ureterovesicular junction. There are multiple additional
nonobstructive calculi within the collecting systems of the kidneys
bilaterally measuring up to 4 mm. No left ureteral stones. Urinary
bladder is otherwise unremarkable. Bilateral kidneys are also
otherwise unremarkable in appearance. Bilateral adrenal glands are
normal in appearance.

Stomach/Bowel: Image 41 of series 2 demonstrates a high density
focus in the hepatic flexure of the colon, which is presumably a
fecalith within a colonic diverticulum. The colonic wall adjacent to
this is markedly thickened, and there is extensive surrounding
inflammation in the transverse mesocolon, indicative of acute
diverticulitis. The unenhanced appearance of the stomach is normal.
No pathologic dilatation of small bowel or colon. Normal appendix.

Vascular/Lymphatic: No significant atherosclerotic disease in the
abdominal or pelvic vasculature. No lymphadenopathy noted in the
abdomen or pelvis on today's non contrast CT examination.

Reproductive: Uterus and ovaries are unremarkable in appearance on
today's non contrast CT examination.

Other: Small volume of ascites in the cul-de-sac, presumably
reactive. No pneumoperitoneum.

Musculoskeletal: Healing C-section scar in the anterior wall of the
pelvis incidentally noted. There are no aggressive appearing lytic
or blastic lesions noted in the visualized portions of the skeleton.
IMPRESSION: 1. 4 mm calcification at or near the right ureterovesicular
junction, suspicious for potential UVJ stone (although this is
difficult to confirm on today's examination). However, the presence
of mild right-sided hydroureteronephrosis does suggest that this is
a distal ureteral stone. Multiple additional nonobstructive calculi
are present within the collecting systems of the kidneys
bilaterally.
2. In addition, however, there is acute diverticulitis in the region
of the hepatic flexure, surrounding what appears to be a partially
calcified fecalith in a diverticulum. No definite diverticular
abscess or signs of frank perforation at this time.
3. Small volume of ascites.
4. Additional incidental findings, as above.

These results were called by telephone at the time of interpretation
on 09/10/2014 at [DATE] to Dr. DIOLINE JJ, who verbally
acknowledged these results.

## 2015-01-31 ENCOUNTER — Other Ambulatory Visit: Payer: Self-pay | Admitting: Family Medicine

## 2015-01-31 NOTE — Telephone Encounter (Signed)
appt scheduled and med refilled 

## 2015-01-31 NOTE — Telephone Encounter (Signed)
Last office visit 07/06/2014 with Dr. Silvio Pate.  Last TSH 05/03/2013.  Ok to refill?

## 2015-01-31 NOTE — Telephone Encounter (Signed)
Please refill for a month  Schedule labs for tsh dx hypothyroid Thanks

## 2015-02-03 ENCOUNTER — Other Ambulatory Visit: Payer: Self-pay | Admitting: Family Medicine

## 2015-02-03 DIAGNOSIS — E038 Other specified hypothyroidism: Secondary | ICD-10-CM

## 2015-02-07 ENCOUNTER — Other Ambulatory Visit (INDEPENDENT_AMBULATORY_CARE_PROVIDER_SITE_OTHER): Payer: 59

## 2015-02-07 DIAGNOSIS — E038 Other specified hypothyroidism: Secondary | ICD-10-CM

## 2015-02-07 LAB — TSH: TSH: 1.08 u[IU]/mL (ref 0.35–4.50)

## 2015-02-08 ENCOUNTER — Encounter: Payer: Self-pay | Admitting: *Deleted

## 2015-02-28 ENCOUNTER — Other Ambulatory Visit: Payer: Self-pay | Admitting: Family Medicine

## 2015-05-01 ENCOUNTER — Other Ambulatory Visit: Payer: Self-pay | Admitting: Family Medicine

## 2015-07-13 ENCOUNTER — Other Ambulatory Visit: Payer: Self-pay | Admitting: Family Medicine

## 2016-01-11 ENCOUNTER — Other Ambulatory Visit: Payer: Self-pay | Admitting: Family Medicine

## 2016-01-11 NOTE — Telephone Encounter (Signed)
No recent appts or labs and no future appt please advise

## 2016-01-11 NOTE — Telephone Encounter (Signed)
Please schedule f/u and refill until then  

## 2016-01-12 NOTE — Telephone Encounter (Signed)
appt scheduled and med refilled 

## 2016-01-14 ENCOUNTER — Other Ambulatory Visit: Payer: Self-pay | Admitting: Family Medicine

## 2016-03-18 ENCOUNTER — Ambulatory Visit (INDEPENDENT_AMBULATORY_CARE_PROVIDER_SITE_OTHER): Payer: 59 | Admitting: Family Medicine

## 2016-03-18 ENCOUNTER — Encounter: Payer: Self-pay | Admitting: Family Medicine

## 2016-03-18 VITALS — BP 118/70 | HR 62 | Temp 98.0°F | Ht 64.0 in | Wt 154.0 lb

## 2016-03-18 DIAGNOSIS — E785 Hyperlipidemia, unspecified: Secondary | ICD-10-CM

## 2016-03-18 DIAGNOSIS — E039 Hypothyroidism, unspecified: Secondary | ICD-10-CM

## 2016-03-18 LAB — TSH: TSH: 2.14 u[IU]/mL (ref 0.35–4.50)

## 2016-03-18 NOTE — Assessment & Plan Note (Signed)
More fatigue lately- will see if that is due to her thyroid or schedule TSH today inst to take levothyroxine away from vitamins and food  Pend result to refill

## 2016-03-18 NOTE — Progress Notes (Signed)
Pre visit review using our clinic review tool, if applicable. No additional management support is needed unless otherwise documented below in the visit note. 

## 2016-03-18 NOTE — Patient Instructions (Signed)
Labs today  Do your best to get more sleep  Melatonin is helpful for shift work Get exercise when you can Try to make better food choices and get rid of the soda  Drink water when you can

## 2016-03-18 NOTE — Progress Notes (Signed)
Subjective:    Patient ID: Victoria Russell, female    DOB: 1985-12-24, 30 y.o.   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  When she finally eats - then it is before bed  Not picking the healthier foods   Did just start the hello fresh program -some dinners are better  Making effort to drink water in between shifts -and get off soda (calories)  Wt is stable - she would like to weigh less (135)  bmi of 26.4  Hypothyroid Lab Results  Component Value Date   TSH 1.08 02/07/2015   she is really tired all the time- unsure whether this is from thyroid Only gets 4-5 hours of sleep daily  Takes her levothyroxine every am - away from food   For a while took caffeine pills - stopped them   Hyperlipidemia-hx of  Lab Results  Component Value Date   CHOL 177 03/14/2010   HDL 69.20 03/14/2010   LDLCALC 87 03/14/2010   TRIG 102.0 03/14/2010   CHOLHDL 3 03/14/2010   recently re checked at work-it was quite good as well   Patient Active Problem List   Diagnosis Date Noted  . Diverticulitis of colon without hemorrhage 09/12/2014  . S/P cesarean section 08/22/2014  . Herpes zoster 07/06/2014  . Pregnancy 06/09/2012  . Neoplasm of uncertain behavior of skin 04/13/2012  . Gynecological examination 11/15/2011  . Pre-conception counseling 11/15/2011  . Palpitations 06/14/2011  . Headache(784.0) 04/17/2011  . RENAL CALCULUS, HX OF 12/16/2008  . DYSMENORRHEA 10/19/2007  . Hypothyroidism 12/25/2006  . Hyperlipidemia 12/25/2006   Past Medical History  Diagnosis Date  . ADHD (attention deficit hyperactivity disorder)   . Hypothyroidism   . Hyperhidrosis   . Atypical moles     dysplasia  . History of kidney stones     2012, passed stones, no surgery required    . Right ureteral stone 3/09  . Dysmenorrhea   . HLD (hyperlipidemia)     diet controlled  . Hx of varicella   . Wolff-Parkinson-White (WPW) syndrome     history, no problems since age 32 yrs old  . Pregnancy induced hypertension     resolved with delivery of 01/2013 pregnancy  . Anxiety     no meds  . Diverticulitis    Past Surgical History  Procedure Laterality Date  . Tonsillectomy and adenoidectomy    . Myringotomy    . Mole removal      dysplastic x 2 - right breast and right glut  . Cystoscopy    . Cesarean section N/A 01/29/2013    Procedure: CESAREAN SECTION;  Surgeon: Cyril Mourning, MD;  Location: [facility];  Service: Obstetrics;  Laterality: N/A;  . Wisdom tooth extraction    . Cesarean section N/A 08/22/2014    Procedure: CESAREAN SECTION;  Surgeon: Cyril Mourning, MD;  Location: [facility];  Service: Obstetrics;  Laterality: N/A;  Repeat edc 08/29/14   Social History  Substance Use Topics  . Smoking status: Never Smoker   . Smokeless tobacco: Never Used  . Alcohol Use: No     Comment: Social   Family History  Problem Relation Age of Onset  . [place] White  syndrome Mother   . Hypothyroidism Mother   . Basal cell carcinoma Mother   . Cancer Mother     basal cell skin CA  . Diabetes Maternal Grandfather   . Cervical cancer      MGGM  . Cervical cancer Maternal Grandmother   . Cancer Maternal Grandmother     cervical CA  . Coronary artery disease Paternal Grandmother   . Heart disease Paternal Grandmother     CAD  . Other Sister     Dermoid cyst  . Cancer Other     cervical CA  . Depression Father   . Colon polyps Father   . Colon polyps Mother   . Diabetes Daughter    No Known Allergies Current Outpatient Prescriptions on File Prior to Visit  Medication Sig Dispense Refill  . flintstones complete (FLINTSTONES) 60 MG chewable tablet Chew 2 tablets by mouth daily.    Marland Kitchen levothyroxine (SYNTHROID, LEVOTHROID) 25 MCG tablet TAKE 0.5 TABLETS  (12.5 MCG TOTAL) BY MOUTH DAILY. 45 tablet 0   No current facility-administered medications on file prior to visit.    Review of Systems Review of Systems  Constitutional: Negative for fever, appetite change, and unexpected weight change. pos for fatigue and not enough time for sleep Eyes: Negative for pain and visual disturbance.  Respiratory: Negative for cough and shortness of breath.   Cardiovascular: Negative for cp or palpitations    Gastrointestinal: Negative for nausea, diarrhea and constipation.  Genitourinary: Negative for urgency and frequency.  Skin: Negative for pallor or rash   Neurological: Negative for weakness, light-headedness, numbness and headaches.  Hematological: Negative for adenopathy. Does not bruise/bleed easily.  Psychiatric/Behavioral: Negative for dysphoric mood. The patient is not nervous/anxious.         Objective:   Physical Exam  Constitutional: She appears well-developed and well-nourished. No distress.  Well appearing  HENT:  Head: Normocephalic and atraumatic.  Mouth/Throat: Oropharynx is clear and moist.  Eyes: Conjunctivae and EOM are normal. Pupils are equal, round, and reactive to light.  Neck: Normal range of motion. Neck supple. No JVD present. Carotid bruit is not present. No thyromegaly present.  Cardiovascular: Normal rate, regular rhythm, normal heart sounds and intact distal pulses.  Exam reveals no gallop.   Pulmonary/Chest: Effort normal and breath sounds normal. No respiratory distress. She has no wheezes. She has no rales.  No crackles  Abdominal: Soft. Bowel sounds are normal. She exhibits no abdominal bruit.  Musculoskeletal: She exhibits no edema.  Lymphadenopathy:    She has no cervical adenopathy.  Neurological: She is alert. She has normal reflexes. She displays no tremor.  Skin: Skin is warm and dry. No rash noted.  Psychiatric: She has a normal mood and affect.          Assessment & Plan:   Problem List Items  Addressed This Visit      Endocrine   Hypothyroidism - Primary    More fatigue lately- will see if that is due to her thyroid or schedule TSH today inst to take levothyroxine away from vitamins and food  Pend result to refill       Relevant Orders   TSH     Other   Hyperlipidemia    In the past Last draw here pretty good Per pt -draw at work is also good  Enc to eat a low sat fat diet

## 2016-03-18 NOTE — Assessment & Plan Note (Signed)
In the past Last draw here pretty good Per pt -draw at work is also good  Enc to eat a low sat fat diet

## 2016-03-19 ENCOUNTER — Encounter: Payer: Self-pay | Admitting: *Deleted

## 2016-03-19 MED ORDER — LEVOTHYROXINE SODIUM 25 MCG PO TABS: ORAL_TABLET | ORAL | Status: DC

## 2016-03-19 NOTE — Addendum Note (Signed)
Addended by: Modena Nunnery on: 03/19/2016 09:57 AM   Modules accepted: Orders

## 2016-04-11 ENCOUNTER — Other Ambulatory Visit: Payer: Self-pay | Admitting: Family Medicine

## 2017-04-26 ENCOUNTER — Other Ambulatory Visit: Payer: Self-pay | Admitting: Family Medicine

## 2017-05-24 ENCOUNTER — Other Ambulatory Visit: Payer: Self-pay | Admitting: Family Medicine

## 2017-06-24 ENCOUNTER — Other Ambulatory Visit: Payer: Self-pay | Admitting: Family Medicine

## 2017-06-24 NOTE — Telephone Encounter (Signed)
Pt hasn't been seen in over a year and no future appts., please advise  

## 2017-06-24 NOTE — Telephone Encounter (Signed)
Please schedule a f/u and refill until then 

## 2017-06-25 NOTE — Telephone Encounter (Signed)
Left voicemail letting pt know she needs to schedule f/u before we can refill her medication

## 2017-06-26 ENCOUNTER — Ambulatory Visit (INDEPENDENT_AMBULATORY_CARE_PROVIDER_SITE_OTHER)
Admission: RE | Admit: 2017-06-26 | Discharge: 2017-06-26 | Disposition: A | Discharge status: Home / Self Care | Payer: 59 | Source: Ambulatory Visit | Attending: Family Medicine | Admitting: Family Medicine

## 2017-06-26 ENCOUNTER — Ambulatory Visit (INDEPENDENT_AMBULATORY_CARE_PROVIDER_SITE_OTHER): Payer: 59 | Admitting: Family Medicine

## 2017-06-26 ENCOUNTER — Encounter: Payer: Self-pay | Admitting: Family Medicine

## 2017-06-26 VITALS — BP 124/76 | HR 75 | Temp 98.5°F | Wt 161.8 lb

## 2017-06-26 DIAGNOSIS — R109 Unspecified abdominal pain: Secondary | ICD-10-CM | POA: Diagnosis not present

## 2017-06-26 DIAGNOSIS — R1084 Generalized abdominal pain: Secondary | ICD-10-CM

## 2017-06-26 LAB — POC URINALSYSI DIPSTICK (AUTOMATED)
Bilirubin, UA: NEGATIVE
Glucose, UA: NEGATIVE
KETONES UA: NEGATIVE
Nitrite, UA: NEGATIVE
PH UA: 7 (ref 5.0–8.0)
Protein, UA: NEGATIVE
RBC UA: NEGATIVE
Spec Grav, UA: 1.015 (ref 1.010–1.025)
UROBILINOGEN UA: 0.2 U/dL

## 2017-06-26 MED ORDER — HYDROCODONE-ACETAMINOPHEN 5-325 MG PO TABS: 1.0000 | ORAL_TABLET | Freq: Four times a day (QID) | ORAL | 0 refills | Status: DC | PRN

## 2017-06-26 MED ORDER — TAMSULOSIN HCL 0.4 MG PO CAPS: 0.4000 mg | ORAL_CAPSULE | Freq: Every day | ORAL | 1 refills | Status: DC | PRN

## 2017-06-26 MED ORDER — CIPROFLOXACIN HCL 500 MG PO TABS: 500.0000 mg | ORAL_TABLET | Freq: Two times a day (BID) | ORAL | 0 refills | Status: DC

## 2017-06-26 NOTE — Patient Instructions (Signed)
We'll contact you with your lab report. Start flomax, use hydrocodone if needed- sedation caution.  If you have any burning with urination or fevers, then start cipro and update [place].  Take care.  Glad to see you.

## 2017-06-26 NOTE — Progress Notes (Signed)
Sx started about 5 days ago with flank pain, on the R side.  She did better until today.  She had more pain this AM, a little better now, but still in pain.  She had trouble with urination this AM (slow stream, dribbling), some better now- she had prev flow rate changes, not burning with urination.  No blood in urine seen by patient.   H/o renal stones.  No FCNAVD now.  She has some sweats and nausea related to pain.  This felt a prev stone.   No blood with BMs.   Meds, vitals, and allergies reviewed.   ROS: Per HPI unless specifically indicated in ROS section   GEN: nad, alert and oriented HEENT: mucous membranes moist NECK: supple w/o LA CV: rrr. PULM: ctab, no inc wob ABD: soft, +bs, R flank and lower abd ttp but no CVA pain.  EXT: no edema SKIN: no acute rash

## 2017-06-27 LAB — URINE CULTURE
MICRO NUMBER:: 81041686
SPECIMEN QUALITY:: ADEQUATE

## 2017-06-29 NOTE — Assessment & Plan Note (Signed)
Likely from nephrolithiasis. Discussed with patient. Imaging reviewed with patient. See report on imaging. Start flomax, use hydrocodone if needed- sedation caution.  If she has any burning with urination or fevers, then start cipro and update [place].  She agrees. Okay for outpatient f/u.

## 2017-07-02 NOTE — Telephone Encounter (Signed)
Left 2nd voicemail requesting pt to call the office and schedule an appt before we can refill med

## 2017-07-03 NOTE — Telephone Encounter (Signed)
Pt called back and scheduled an appt, med refilled

## 2017-07-07 ENCOUNTER — Encounter: Payer: Self-pay | Admitting: Family Medicine

## 2017-07-07 ENCOUNTER — Ambulatory Visit (INDEPENDENT_AMBULATORY_CARE_PROVIDER_SITE_OTHER): Payer: 59 | Admitting: Family Medicine

## 2017-07-07 VITALS — BP 112/68 | HR 58 | Temp 98.5°F | Ht 64.0 in | Wt 161.5 lb

## 2017-07-07 DIAGNOSIS — E559 Vitamin D deficiency, unspecified: Secondary | ICD-10-CM | POA: Insufficient documentation

## 2017-07-07 DIAGNOSIS — E039 Hypothyroidism, unspecified: Secondary | ICD-10-CM

## 2017-07-07 MED ORDER — SYNTHROID 25 MCG PO TABS: ORAL_TABLET | ORAL | 11 refills | Status: DC

## 2017-07-07 NOTE — Assessment & Plan Note (Signed)
Check level today  On otc supplement  diag by gyn

## 2017-07-07 NOTE — Progress Notes (Signed)
Subjective:    Patient ID: Victoria Russell, female    DOB: Jul 11, 1986, 31 y.o.   MRN: [id number]  HPI Here for f/u of chronic health problems   Working a lot  Took girls to the beach this summer with family  Feeling great   New job with better schedule 7-3:30  Much more sleep 7-8 hours instead of 4-5     Wt Readings from Last 3 Encounters:  07/07/17 161 lb 8 oz (73.3 kg)  06/26/17 161 lb 12 oz (73.4 kg)  03/18/16 154 lb (69.9 kg)  gained a bit  Trying to loose weight  With new job- getting treadmill out to start working out daily  27.72 kg/m   Hypothyroidism - does not feel any different  Some fatigue- no more than last year   (req check vit D check) Pt has no clinical changes No change in energy level/ hair or skin/ edema and no tremor Lab Results  Component Value Date   TSH 2.14 03/18/2016    Due for labs today  Takes med correctly - and no missed doses  No thyroid enlargement   Takes 5000 iu vit d daily  Low in the past   Needs to get set up with Dr Helane Rima  Does her own breast checks-no lumps  Mom had breast cancer in 48s   Declines flu shot   Patient Active Problem List   Diagnosis Date Noted  . Vitamin D deficiency 07/07/2017  . Diverticulitis of colon without hemorrhage 09/12/2014  . S/P cesarean section 08/22/2014  . Herpes zoster 07/06/2014  . Pregnancy 06/09/2012  . Neoplasm of uncertain behavior of skin 04/13/2012  . Gynecological examination 11/15/2011  . Pre-conception counseling 11/15/2011  . Palpitations 06/14/2011  . Headache(784.0) 04/17/2011  . Flank pain 06/28/2010  . RENAL CALCULUS, HX OF 12/16/2008  . DYSMENORRHEA 10/19/2007  . Hypothyroidism 12/25/2006  . Hyperlipidemia 12/25/2006   Past Medical History:  Diagnosis Date  . ADHD (attention deficit hyperactivity disorder)   . Anxiety    no meds  . Atypical moles    dysplasia  . Diverticulitis   . Dysmenorrhea   . History of kidney stones    2012, passed stones, no surgery  required  . HLD (hyperlipidemia)    diet controlled  . Hx of varicella   . Hyperhidrosis   . Hypothyroidism   . Pregnancy induced hypertension    resolved with delivery of 01/2013 pregnancy  . Right ureteral stone 3/09  . Wolff-Parkinson-White (WPW) syndrome    history, no problems since age 85 yrs old   Past Surgical History:  Procedure Laterality Date  . CESAREAN SECTION N/A 01/29/2013   Procedure: CESAREAN SECTION;  Surgeon: Cyril Mourning, MD;  Location: [facility];  Service: Obstetrics;  Laterality: N/A;  . CESAREAN SECTION N/A 08/22/2014   Procedure: CESAREAN SECTION;  Surgeon: Cyril Mourning, MD;  Location: [facility];  Service: Obstetrics;  Laterality: N/A;  Repeat edc 08/29/14  . CYSTOSCOPY    . MOLE REMOVAL     dysplastic x 2 - right breast and right glut  . MYRINGOTOMY    . TONSILLECTOMY AND ADENOIDECTOMY    . WISDOM TOOTH EXTRACTION     Social History  Substance Use Topics  . Smoking status: Never Smoker  . Smokeless tobacco: Never Used  . Alcohol use No     Comment: Social   Family History  Problem Relation Age of Onset  . [place] White syndrome Mother   .  Hypothyroidism Mother   . Basal cell carcinoma Mother   . Cancer Mother        basal cell skin CA  . Diabetes Maternal Grandfather   . Cervical cancer Unknown        MGGM  . Cervical cancer Maternal Grandmother   . Cancer Maternal Grandmother        cervical CA  . Coronary artery disease Paternal Grandmother   . Heart disease Paternal Grandmother        CAD  . Other Sister        Dermoid cyst  . Cancer Other        cervical CA  . Depression Father   . Colon polyps Father   . Colon polyps Mother   . Diabetes Daughter    No Known Allergies No current outpatient prescriptions on file prior to visit.   No current facility-administered medications on file prior to visit.     Review of Systems  Constitutional: Negative for activity change, appetite change, fatigue, fever and unexpected  weight change.       Pos for fatigue that is imp with new schedule   HENT: Negative for congestion, ear pain, rhinorrhea, sinus pressure and sore throat.   Eyes: Negative for pain, redness and visual disturbance.  Respiratory: Negative for cough, shortness of breath and wheezing.   Cardiovascular: Negative for chest pain and palpitations.  Gastrointestinal: Negative for abdominal pain, blood in stool, constipation and diarrhea.  Endocrine: Negative for polydipsia and polyuria.  Genitourinary: Negative for dysuria, frequency and urgency.  Musculoskeletal: Negative for arthralgias, back pain and myalgias.  Skin: Negative for pallor and rash.  Allergic/Immunologic: Negative for environmental allergies.  Neurological: Negative for dizziness, tremors, syncope, weakness and headaches.  Hematological: Negative for adenopathy. Does not bruise/bleed easily.  Psychiatric/Behavioral: Negative for decreased concentration and dysphoric mood. The patient is not nervous/anxious.        Objective:   Physical Exam  Constitutional: She appears well-developed and well-nourished. No distress.  Well appearing   HENT:  Head: Normocephalic and atraumatic.  Mouth/Throat: Oropharynx is clear and moist.  Eyes: Pupils are equal, round, and reactive to light. Conjunctivae and EOM are normal.  Neck: Normal range of motion. Neck supple. No JVD present. Carotid bruit is not present. No thyromegaly present.  Cardiovascular: Normal rate, regular rhythm, normal heart sounds and intact distal pulses.  Exam reveals no gallop.   Pulmonary/Chest: Effort normal and breath sounds normal. No respiratory distress. She has no wheezes. She has no rales.  No crackles  Abdominal: She exhibits no abdominal bruit.  Musculoskeletal: She exhibits no edema.  Lymphadenopathy:    She has no cervical adenopathy.  Neurological: She is alert. She has normal reflexes. She displays no tremor. No cranial nerve deficit.  Skin: Skin is warm  and dry. No rash noted.  Psychiatric: She has a normal mood and affect.          Assessment & Plan:   Problem List Items Addressed This Visit      Endocrine   Hypothyroidism - Primary    Due for labs  No clinical changes  TSH today  No missed doses of synthroid (daw) 25 mcg Nl exam      Relevant Medications   SYNTHROID 25 MCG tablet   Other Relevant Orders   TSH     Other   Vitamin D deficiency    Check level today  On otc supplement  diag by gyn  Relevant Orders   VITAMIN D 25 Hydroxy (Vit-D Deficiency, Fractures)

## 2017-07-07 NOTE — Assessment & Plan Note (Signed)
Due for labs  No clinical changes  TSH today  No missed doses of synthroid (daw) 25 mcg Nl exam

## 2017-07-07 NOTE — Patient Instructions (Signed)
Enjoy your new schedule Get back on track with exercise Lab for thyroid and vit D today

## 2017-07-08 ENCOUNTER — Telehealth: Payer: Self-pay | Admitting: Radiology

## 2017-07-08 LAB — TSH: TSH: 2.24 u[IU]/mL (ref 0.35–4.50)

## 2017-07-08 LAB — VITAMIN D 25 HYDROXY (VIT D DEFICIENCY, FRACTURES): VITD: 171.07 ng/mL (ref 30.00–100.00)

## 2017-07-08 NOTE — Telephone Encounter (Signed)
[facility] called critical Vit D level, 171.07, results given to Dr Glori Bickers

## 2017-07-08 NOTE — Telephone Encounter (Signed)
Pt notified of Dr. Marliss Coots comments and instructions and lab appt scheduled

## 2017-07-08 NOTE — Telephone Encounter (Signed)
Left voicemail requesting pt to call the office back 

## 2017-07-08 NOTE — Telephone Encounter (Signed)
Please tell her to stop all vit D and any supplements with vit D  Re check 4-6 weeks for high D level

## 2017-08-11 ENCOUNTER — Other Ambulatory Visit: Payer: Self-pay | Admitting: Family Medicine

## 2017-08-11 ENCOUNTER — Telehealth: Payer: Self-pay | Admitting: Family Medicine

## 2017-08-11 MED ORDER — SYNTHROID 25 MCG PO TABS: ORAL_TABLET | ORAL | 11 refills | Status: DC

## 2017-08-11 NOTE — Telephone Encounter (Signed)
Spoke with pharmacy and they didn't get refill from 07/07/17, Rx resent

## 2017-08-11 NOTE — Telephone Encounter (Signed)
Copied from [facility] #[id number]. Topic: Inquiry >> Aug 11, 2017  3:23 PM Oliver Pila B wrote: Reason for CRM: PT is asking for synthroid refill contact the pharmacy and have them contact the PT when its ready, PT also stated that she has an appt this Wednesday

## 2017-08-13 ENCOUNTER — Other Ambulatory Visit (INDEPENDENT_AMBULATORY_CARE_PROVIDER_SITE_OTHER): Payer: 59

## 2017-08-13 ENCOUNTER — Telehealth: Payer: Self-pay | Admitting: Radiology

## 2017-08-13 DIAGNOSIS — E559 Vitamin D deficiency, unspecified: Secondary | ICD-10-CM | POA: Diagnosis not present

## 2017-08-13 LAB — VITAMIN D 25 HYDROXY (VIT D DEFICIENCY, FRACTURES): VITD: 112.79 ng/mL — AB (ref 30.00–100.00)

## 2017-08-13 NOTE — Telephone Encounter (Signed)
Call  Victoria Russell  Vit D levels are still high - be sure not to take any Vit D supplements at all.   We miss her.

## 2017-08-13 NOTE — Telephone Encounter (Signed)
Victoria Russell notified as instructed by telephone.

## 2017-08-13 NOTE — Telephone Encounter (Signed)
[facility] called a critical result, VIT D - 112.79, results given to Dr Lorelei Pont

## 2017-09-24 ENCOUNTER — Ambulatory Visit
Admission: RE | Admit: 2017-09-24 | Discharge: 2017-09-24 | Disposition: A | Discharge status: Home / Self Care | Payer: 59 | Source: Ambulatory Visit | Attending: Family Medicine | Admitting: Family Medicine

## 2017-09-24 ENCOUNTER — Encounter: Payer: Self-pay | Admitting: Family Medicine

## 2017-09-24 ENCOUNTER — Ambulatory Visit: Payer: 59 | Admitting: Family Medicine

## 2017-09-24 VITALS — BP 118/78 | HR 89 | Temp 98.2°F | Wt 162.5 lb

## 2017-09-24 DIAGNOSIS — R3129 Other microscopic hematuria: Secondary | ICD-10-CM

## 2017-09-24 DIAGNOSIS — Z87442 Personal history of urinary calculi: Secondary | ICD-10-CM

## 2017-09-24 DIAGNOSIS — R109 Unspecified abdominal pain: Secondary | ICD-10-CM

## 2017-09-24 LAB — POC URINALSYSI DIPSTICK (AUTOMATED)
Bilirubin, UA: NEGATIVE
GLUCOSE UA: NEGATIVE
KETONES UA: NEGATIVE
Leukocytes, UA: NEGATIVE
Nitrite, UA: NEGATIVE
PROTEIN UA: NEGATIVE
SPEC GRAV UA: 1.015 (ref 1.010–1.025)
UROBILINOGEN UA: 0.2 U/dL
pH, UA: 7.5 (ref 5.0–8.0)

## 2017-09-24 LAB — CBC WITH DIFFERENTIAL/PLATELET
BASOS PCT: 0.8 % (ref 0.0–3.0)
Basophils Absolute: 0.1 10*3/uL (ref 0.0–0.1)
EOS PCT: 1 % (ref 0.0–5.0)
Eosinophils Absolute: 0.1 10*3/uL (ref 0.0–0.7)
HCT: 40.2 % (ref 36.0–46.0)
HEMOGLOBIN: 13.1 g/dL (ref 12.0–15.0)
Lymphocytes Relative: 22 % (ref 12.0–46.0)
Lymphs Abs: 2.5 10*3/uL (ref 0.7–4.0)
MCHC: 32.5 g/dL (ref 30.0–36.0)
MCV: 88.7 fl (ref 78.0–100.0)
MONOS PCT: 6.6 % (ref 3.0–12.0)
Monocytes Absolute: 0.7 10*3/uL (ref 0.1–1.0)
Neutro Abs: 7.8 10*3/uL — ABNORMAL HIGH (ref 1.4–7.7)
Neutrophils Relative %: 69.6 % (ref 43.0–77.0)
Platelets: 274 10*3/uL (ref 150.0–400.0)
RBC: 4.53 Mil/uL (ref 3.87–5.11)
RDW: 13 % (ref 11.5–15.5)
WBC: 11.3 10*3/uL — ABNORMAL HIGH (ref 4.0–10.5)

## 2017-09-24 LAB — COMPREHENSIVE METABOLIC PANEL
ALBUMIN: 4 g/dL (ref 3.5–5.2)
ALK PHOS: 65 U/L (ref 39–117)
ALT: 7 U/L (ref 0–35)
AST: 11 U/L (ref 0–37)
BUN: 11 mg/dL (ref 6–23)
CALCIUM: 9 mg/dL (ref 8.4–10.5)
CO2: 29 mEq/L (ref 19–32)
Chloride: 103 mEq/L (ref 96–112)
Creatinine, Ser: 0.83 mg/dL (ref 0.40–1.20)
GFR: 85.45 mL/min (ref >60.00)
Glucose, Bld: 90 mg/dL (ref 70–99)
POTASSIUM: 3.7 meq/L (ref 3.5–5.1)
Sodium: 138 mEq/L (ref 135–145)
TOTAL PROTEIN: 7.2 g/dL (ref 6.0–8.3)
Total Bilirubin: 0.3 mg/dL (ref 0.2–1.2)

## 2017-09-24 LAB — URINALYSIS, MICROSCOPIC ONLY

## 2017-09-24 LAB — POCT URINE PREGNANCY: PREG TEST UR: NEGATIVE

## 2017-09-24 MED ORDER — HYDROCODONE-ACETAMINOPHEN 5-325 MG PO TABS: 1.0000 | ORAL_TABLET | Freq: Four times a day (QID) | ORAL | 0 refills | Status: DC | PRN

## 2017-09-24 NOTE — Patient Instructions (Addendum)
Please stop and see Victoria Russell to schedule your renal stone CT  Continue flomax and good fluid intake  If pain worsens or you develop fever, please let us know or go to ER

## 2017-09-24 NOTE — Progress Notes (Signed)
Subjective:    Patient ID: Victoria Russell, female    DOB: 10/10/1985, 31 y.o.   MRN: [id number]  HPI This is a 31 yo female who presents today with back pain x 6 days. Pain mostly on right side. Pain comes and goes, becoming more constant. Has increased water intake, cranberry. Has had multiple kidney stones and has seen urology in past. Has started flomax. Poor water drinker, drinks a lot of soda.   Past Medical History:  Diagnosis Date  . ADHD (attention deficit hyperactivity disorder)   . Anxiety    no meds  . Atypical moles    dysplasia  . Diverticulitis   . Dysmenorrhea   . History of kidney stones    2012, passed stones, no surgery required  . HLD (hyperlipidemia)    diet controlled  . Hx of varicella   . Hyperhidrosis   . Hypothyroidism   . Pregnancy induced hypertension    resolved with delivery of 01/2013 pregnancy  . Right ureteral stone 3/09  . Wolff-Parkinson-White (WPW) syndrome    history, no problems since age 17 yrs old   Past Surgical History:  Procedure Laterality Date  . CESAREAN SECTION N/A 01/29/2013   Procedure: CESAREAN SECTION;  Surgeon: Cyril Mourning, MD;  Location: [facility];  Service: Obstetrics;  Laterality: N/A;  . CESAREAN SECTION N/A 08/22/2014   Procedure: CESAREAN SECTION;  Surgeon: Cyril Mourning, MD;  Location: [facility];  Service: Obstetrics;  Laterality: N/A;  Repeat edc 08/29/14  . CYSTOSCOPY    . MOLE REMOVAL     dysplastic x 2 - right breast and right glut  . MYRINGOTOMY    . TONSILLECTOMY AND ADENOIDECTOMY    . WISDOM TOOTH EXTRACTION     Family History  Problem Relation Age of Onset  . [place] White syndrome Mother   . Hypothyroidism Mother   . Basal cell carcinoma Mother   . Cancer Mother        basal cell skin CA  . Diabetes Maternal Grandfather   . Cervical cancer Unknown        MGGM  . Cervical cancer Maternal Grandmother   . Cancer Maternal Grandmother        cervical CA  . Coronary artery disease Paternal  Grandmother   . Heart disease Paternal Grandmother        CAD  . Other Sister        Dermoid cyst  . Cancer Other        cervical CA  . Depression Father   . Colon polyps Father   . Colon polyps Mother   . Diabetes Daughter    Social History   Tobacco Use  . Smoking status: Never Smoker  . Smokeless tobacco: Never Used  Substance Use Topics  . Alcohol use: No    Alcohol/week: 0.0 oz    Comment: Social  . Drug use: No      Review of Systems Per HPI    Objective:   Physical Exam Physical Exam  Constitutional: She is oriented to person, place, and time. She appears well-developed and well-nourished. No distress.  HENT:  Head: Normocephalic and atraumatic.  Cardiovascular: Normal rate, regular rhythm and normal heart sounds.   Pulmonary/Chest: Effort normal and breath sounds normal.  Abdominal: Soft. She exhibits no distension. There is no tenderness. There is no rebound, no guarding and right sided flank pain.   Neurological: She is alert and oriented to person, place, and time.  Skin: Skin is warm and dry. She is not diaphoretic.  Psychiatric: She has a normal mood and affect. Her behavior is normal. Judgment and thought content normal.  Vitals reviewed.     BP 118/78 (BP Location: Left Arm, Patient Position: Sitting, Cuff Size: Normal)   Pulse 89   Temp 98.2 F (36.8 C) (Oral)   Wt 162 lb 8 oz (73.7 kg)   LMP 08/25/2017   SpO2 98%   BMI 27.89 kg/m  Wt Readings from Last 3 Encounters:  09/24/17 162 lb 8 oz (73.7 kg)  07/07/17 161 lb 8 oz (73.3 kg)  06/26/17 161 lb 12 oz (73.4 kg)       Assessment & Plan:  1. Flank pain - POCT Urinalysis Dipstick (Automated) - Urine Microscopic - POCT urine pregnancy - CBC with Differential - Comprehensive metabolic panel - CT RENAL STONE STUDY; Future - HYDROcodone-acetaminophen (NORCO/VICODIN) 5-325 MG tablet; Take 1 tablet by mouth every 6 (six) hours as needed for moderate pain.  Dispense: 10 tablet; Refill:  0  2. Personal history of kidney stones - POCT urine pregnancy - CBC with Differential - Comprehensive metabolic panel - CT RENAL STONE STUDY; Future  3. Hematuria, microscopic - CBC with Differential - Comprehensive metabolic panel - CT RENAL STONE STUDY; Future   Clarene Reamer, FNP-BC  [facility], [facility]  09/24/2017 3:04 PM

## 2017-10-17 ENCOUNTER — Other Ambulatory Visit: Payer: Self-pay | Admitting: Family Medicine

## 2017-10-17 NOTE — Telephone Encounter (Signed)
Electronic refill request Last office visit 09/24/17/acute Medication is no longer on med list

## 2017-11-10 LAB — HM PAP SMEAR: HM Pap smear: NEGATIVE

## 2017-11-10 IMAGING — DX DG ABDOMEN 1V
2 series · 2 of 2 positions shown · non-contrast
Comparison: CT 09/10/2014.

CLINICAL DATA: Renal stone disease.  Right flank pain.

EXAM:
ABDOMEN - 1 VIEW

[abdomen kub (1 of 2)]
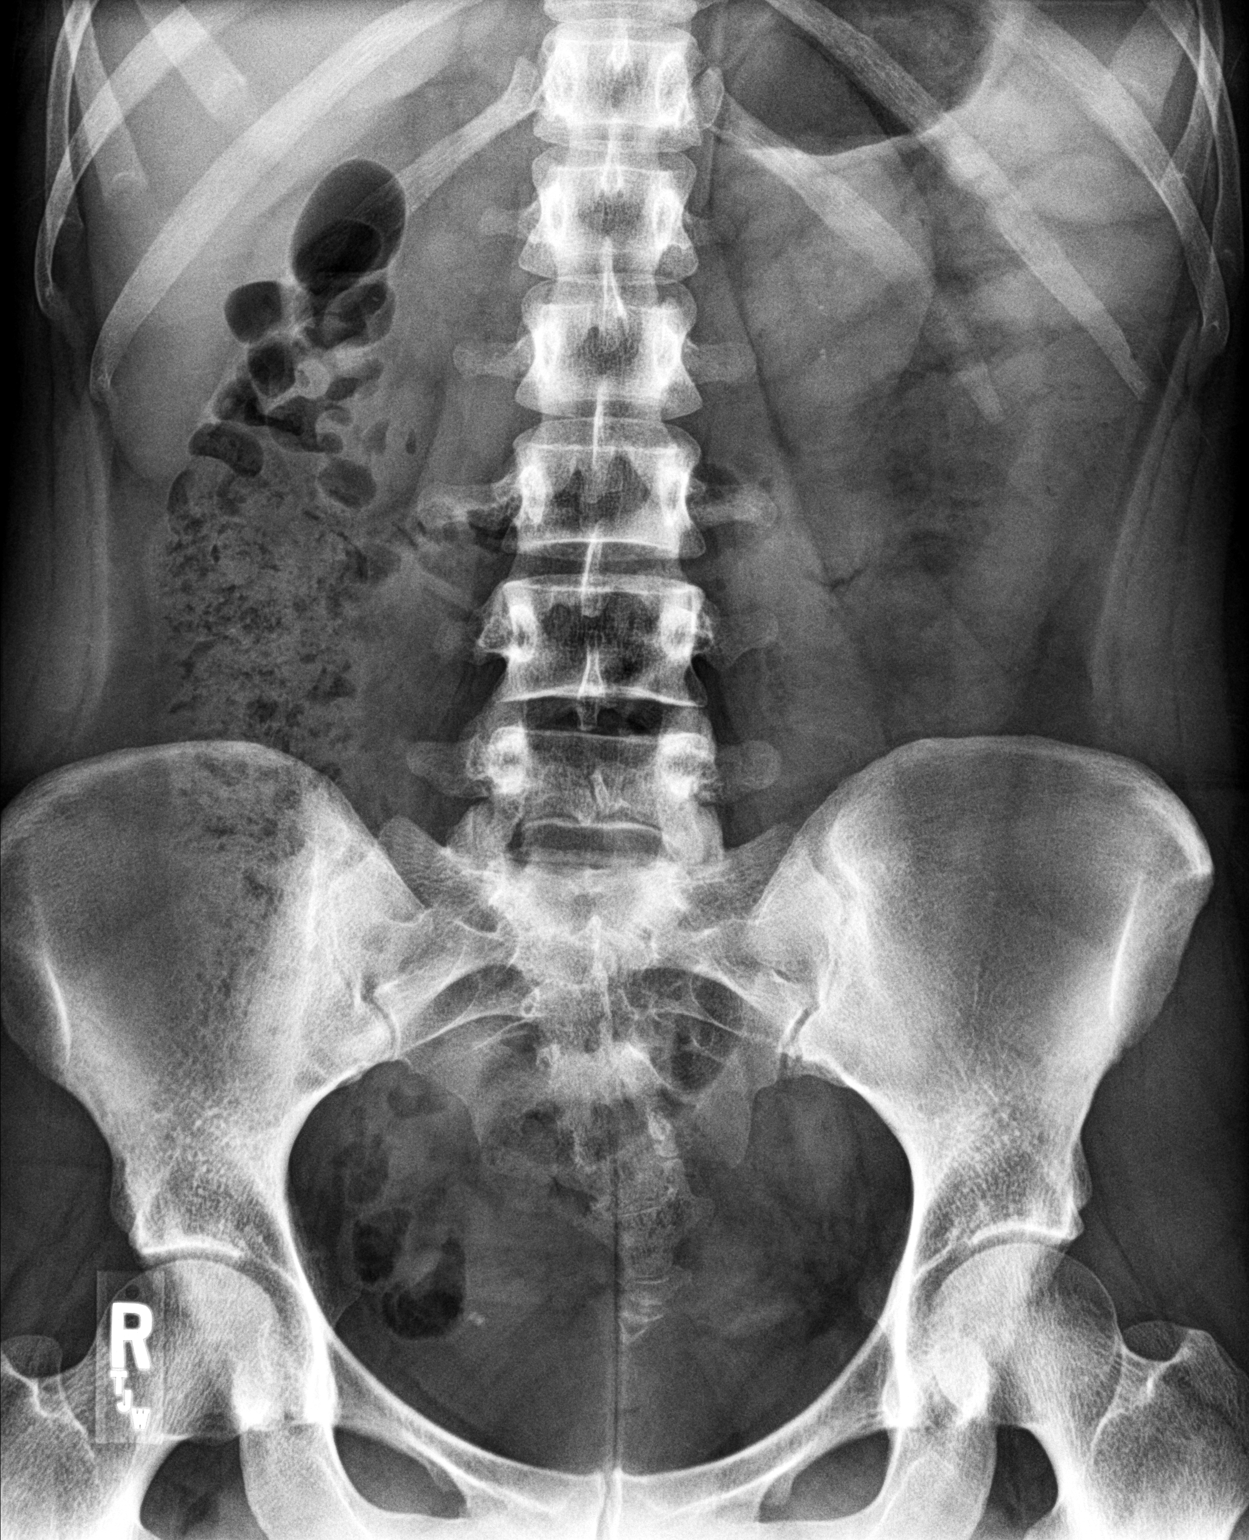

[abdomen kub (2 of 2)]
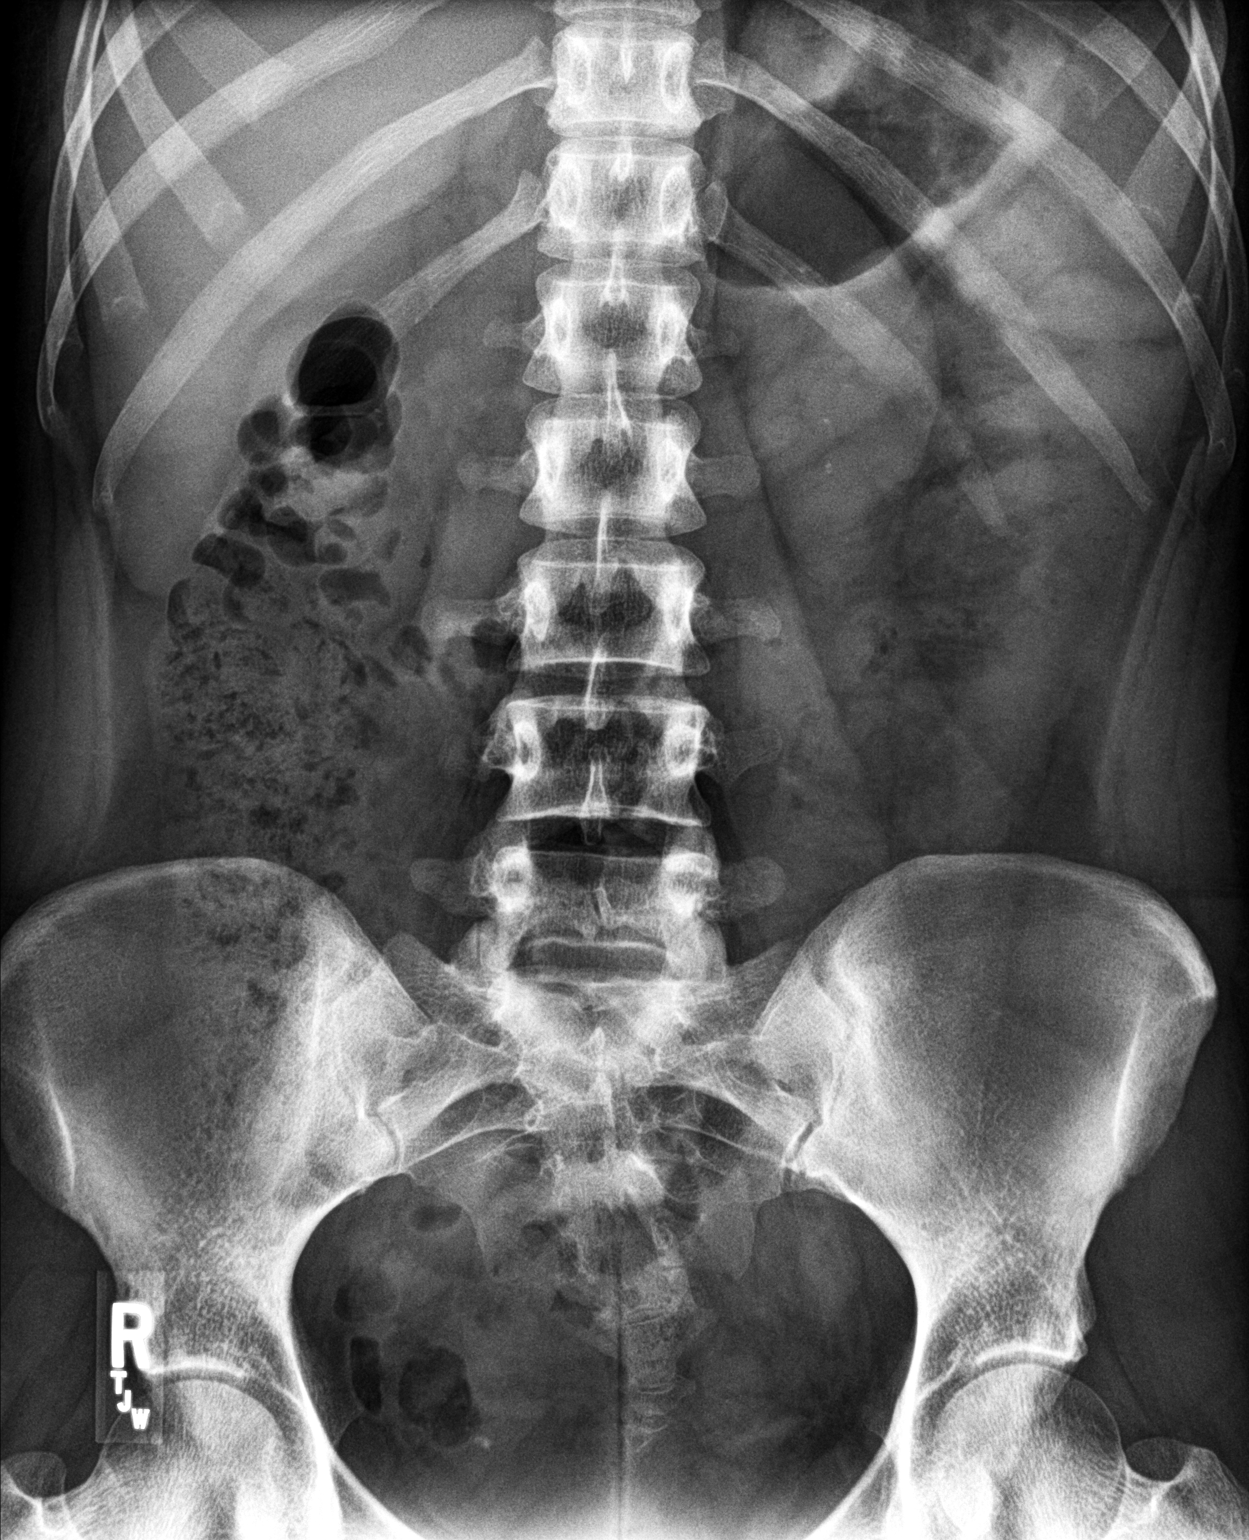

[2 of 2 positions shown; findings below may reference images not displayed]

FINDINGS: Bilateral nephrolithiasis. Two 3 mm calcific densities noted in the
right lower pelvis. These could represent a distal right ureteral
stones. Stone disease is in this region on prior CT of 09/10/2014.
Stool noted throughout the colon. No bowel distention. No acute bony
abnormality. Spina bifida occulta L5 .
IMPRESSION: Bilateral nephrolithiasis. Two 3 mm calcific densities noted over
the right lower pelvis. These could represent distal right ureteral
stones.

## 2018-01-08 ENCOUNTER — Ambulatory Visit: Payer: 59 | Admitting: Primary Care

## 2018-01-08 DIAGNOSIS — Z0289 Encounter for other administrative examinations: Secondary | ICD-10-CM

## 2018-05-06 ENCOUNTER — Ambulatory Visit: Payer: Self-pay

## 2018-05-06 NOTE — Telephone Encounter (Signed)
Pt. Reports she thinks she injured herself Sunday when she jumped up to assist her daughter. Right ribs,shoulder and neck hurt and she has pain taking a deep breath. Right arm "tingles" at intervals. Rib pain is constant. Pain is a "6-7" on a scale of 1-10. No other symptoms. No availability at [facility]. Offered an appointment at [facility], but she has to pick her children this afternoon. States she will go to urgent care.  Reason for Disposition . [1] Chest pain(s) lasting a few seconds AND [2] persists > 3 days  Answer Assessment - Initial Assessment Questions 1. LOCATION: "Where does it hurt?"       Right rib pain 2. RADIATION: "Does the pain go anywhere else?" (e.g., into neck, jaw, arms, back)     Right shoulder, right neck and around to the back 3. ONSET: "When did the chest pain begin?" (Minutes, hours or days)      Sunday night 4. PATTERN "Does the pain come and go, or has it been constant since it started?"  "Does it get worse with exertion?"      Comes and goes - hurts with movement and hurts with deep breaths 5. DURATION: "How long does it last" (e.g., seconds, minutes, hours)     Rib pain is constant 6. SEVERITY: "How bad is the pain?"  (e.g., Scale 1-10; mild, moderate, or severe)    - MILD (1-3): doesn't interfere with normal activities     - MODERATE (4-7): interferes with normal activities or awakens from sleep    - SEVERE (8-10): excruciating pain, unable to do any normal activities        6-7 7. CARDIAC RISK FACTORS: "Do you have any history of heart problems or risk factors for heart disease?" (e.g., prior heart attack, angina; high blood pressure, diabetes, being overweight, high cholesterol, smoking, or strong family history of heart disease)     No 8. PULMONARY RISK FACTORS: "Do you have any history of lung disease?"  (e.g., blood clots in lung, asthma, emphysema, birth control pills)     No 9. CAUSE: "What do you think is causing the chest pain?"  Maybe a pulled muscle 10. OTHER SYMPTOMS: "Do you have any other symptoms?" (e.g., dizziness, nausea, vomiting, sweating, fever, difficulty breathing, cough)       No 11. PREGNANCY: "Is there any chance you are pregnant?" "When was your last menstrual period?"       No  Protocols used: CHEST PAIN-A-AH

## 2018-09-18 ENCOUNTER — Ambulatory Visit: Payer: 59 | Admitting: Family Medicine

## 2018-09-18 ENCOUNTER — Encounter: Payer: Self-pay | Admitting: Family Medicine

## 2018-09-18 VITALS — BP 104/62 | HR 64 | Temp 98.0°F | Ht 64.0 in | Wt 154.0 lb

## 2018-09-18 DIAGNOSIS — E039 Hypothyroidism, unspecified: Secondary | ICD-10-CM

## 2018-09-18 DIAGNOSIS — J019 Acute sinusitis, unspecified: Secondary | ICD-10-CM | POA: Insufficient documentation

## 2018-09-18 DIAGNOSIS — J0100 Acute maxillary sinusitis, unspecified: Secondary | ICD-10-CM | POA: Diagnosis not present

## 2018-09-18 LAB — TSH: TSH: 1.38 u[IU]/mL (ref 0.35–4.50)

## 2018-09-18 MED ORDER — AMOXICILLIN-POT CLAVULANATE 875-125 MG PO TABS: 1.0000 | ORAL_TABLET | Freq: Two times a day (BID) | ORAL | 0 refills | Status: DC

## 2018-09-18 MED ORDER — FLUTICASONE PROPIONATE 50 MCG/ACT NA SUSP: 2.0000 | Freq: Every day | NASAL | 6 refills | Status: DC

## 2018-09-18 NOTE — Assessment & Plan Note (Signed)
Due for labs No clinical changes No exam changes Will need refill of levothyr (DAW) soon

## 2018-09-18 NOTE — Progress Notes (Signed)
Subjective:    Patient ID: Victoria Russell, female    DOB: 1986-08-21, 32 y.o.   MRN: [id number]  HPI Here for cough/congestion and ear symptoms  2 weeks  3 d for ears   Prod cough - thick and green  Nasal -not bad congestion -worse in her chest  No fever  No facial pain / but glands in neck hurt  Throat is mildly sore  Has pnd   Both ears hurt  Cannot hear well   otc - ibuprofen 800 and sudafed this am  Used daughter's swimmers ear drops  Robitussin one night   Also due for thyroid check Lab Results  Component Value Date   TSH 2.24 07/07/2017    Last pap - with Dr Helane Rima   Patient Active Problem List   Diagnosis Date Noted  . Acute sinusitis 09/18/2018  . Vitamin D deficiency 07/07/2017  . Diverticulitis of colon without hemorrhage 09/12/2014  . S/P cesarean section 08/22/2014  . Herpes zoster 07/06/2014  . Pregnancy 06/09/2012  . Neoplasm of uncertain behavior of skin 04/13/2012  . Gynecological examination 11/15/2011  . Pre-conception counseling 11/15/2011  . Palpitations 06/14/2011  . Headache(784.0) 04/17/2011  . Flank pain 06/28/2010  . RENAL CALCULUS, HX OF 12/16/2008  . DYSMENORRHEA 10/19/2007  . Hypothyroidism 12/25/2006  . Hyperlipidemia 12/25/2006   Past Medical History:  Diagnosis Date  . ADHD (attention deficit hyperactivity disorder)   . Anxiety    no meds  . Atypical moles    dysplasia  . Diverticulitis   . Dysmenorrhea   . History of kidney stones    2012, passed stones, no surgery required  . HLD (hyperlipidemia)    diet controlled  . Hx of varicella   . Hyperhidrosis   . Hypothyroidism   . Pregnancy induced hypertension    resolved with delivery of 01/2013 pregnancy  . Right ureteral stone 3/09  . Wolff-Parkinson-White (WPW) syndrome    history, no problems since age 61 yrs old   Past Surgical History:  Procedure Laterality Date  . CESAREAN SECTION N/A 01/29/2013   Procedure: CESAREAN SECTION;  Surgeon: Cyril Mourning, MD;   Location: [facility];  Service: Obstetrics;  Laterality: N/A;  . CESAREAN SECTION N/A 08/22/2014   Procedure: CESAREAN SECTION;  Surgeon: Cyril Mourning, MD;  Location: [facility];  Service: Obstetrics;  Laterality: N/A;  Repeat edc 08/29/14  . CYSTOSCOPY    . MOLE REMOVAL     dysplastic x 2 - right breast and right glut  . MYRINGOTOMY    . TONSILLECTOMY AND ADENOIDECTOMY    . WISDOM TOOTH EXTRACTION     Social History   Tobacco Use  . Smoking status: Never Smoker  . Smokeless tobacco: Never Used  Substance Use Topics  . Alcohol use: No    Alcohol/week: 0.0 standard drinks    Comment: Social  . Drug use: No   Family History  Problem Relation Age of Onset  . [place] White syndrome Mother   . Hypothyroidism Mother   . Basal cell carcinoma Mother   . Cancer Mother        basal cell skin CA  . Colon polyps Mother   . Cancer Other        cervical CA  . Depression Father   . Colon polyps Father   . Diabetes Maternal Grandfather   . Cervical cancer Other        MGGM  . Cervical cancer Maternal Grandmother   .  Cancer Maternal Grandmother        cervical CA  . Coronary artery disease Paternal Grandmother   . Heart disease Paternal Grandmother        CAD  . Other Sister        Dermoid cyst  . Diabetes Daughter    No Known Allergies Current Outpatient Medications on File Prior to Visit  Medication Sig Dispense Refill  . SYNTHROID 25 MCG tablet TAKE 0.5 TABLETS (12.5 MCG TOTAL) BY MOUTH DAILY. 15 tablet 11   No current facility-administered medications on file prior to visit.     Review of Systems  Constitutional: Positive for appetite change. Negative for fatigue and fever.  HENT: Positive for congestion, ear pain, postnasal drip, rhinorrhea, sinus pressure and sore throat. Negative for nosebleeds and sinus pain.   Eyes: Negative for pain, redness and itching.  Respiratory: Positive for cough. Negative for shortness of breath and wheezing.   Cardiovascular:  Negative for chest pain.  Gastrointestinal: Negative for abdominal pain, diarrhea, nausea and vomiting.  Endocrine: Negative for polyuria.  Genitourinary: Negative for dysuria, frequency and urgency.  Musculoskeletal: Negative for arthralgias and myalgias.  Allergic/Immunologic: Negative for immunocompromised state.  Neurological: Positive for headaches. Negative for dizziness, tremors, syncope, weakness and numbness.  Hematological: Negative for adenopathy. Does not bruise/bleed easily.  Psychiatric/Behavioral: Negative for dysphoric mood. The patient is not nervous/anxious.        Objective:   Physical Exam Constitutional:      General: She is not in acute distress.    Appearance: Normal appearance. She is well-developed. She is not ill-appearing.  HENT:     Head: Normocephalic and atraumatic.     Comments: Sinus tenderness bilat-worse in maxillary areas    Right Ear: External ear normal.     Left Ear: External ear normal.     Ears:     Comments: TMs are dull bilat  No erythema     Nose: Congestion and rhinorrhea present.     Mouth/Throat:     Mouth: Mucous membranes are moist.     Pharynx: No oropharyngeal exudate or posterior oropharyngeal erythema.  Eyes:     General:        Right eye: No discharge.        Left eye: No discharge.     Conjunctiva/sclera: Conjunctivae normal.     Pupils: Pupils are equal, round, and reactive to light.  Neck:     Musculoskeletal: Normal range of motion and neck supple. No muscular tenderness.     Comments: No change in thyroid exam Cardiovascular:     Rate and Rhythm: Normal rate and regular rhythm.  Pulmonary:     Effort: Pulmonary effort is normal. No respiratory distress.     Breath sounds: Normal breath sounds. No stridor. No wheezing, rhonchi or rales.  Chest:     Chest wall: No tenderness.  Lymphadenopathy:     Cervical: No cervical adenopathy.  Skin:    General: Skin is warm and dry.     Capillary Refill: Capillary refill  takes less than 2 seconds.     Coloration: Skin is not pale.     Findings: No erythema or rash.     Comments: Not dry  Neurological:     Mental Status: She is alert.     Cranial Nerves: No cranial nerve deficit.     Deep Tendon Reflexes: Reflexes normal.     Comments: No tremor   Psychiatric:  Mood and Affect: Mood normal.           Assessment & Plan:   Problem List Items Addressed This Visit      Respiratory   Acute sinusitis    S/p 2 wk of uri Now ear and sinus pain with purulent drainage Cover with augmentin flonase ns  Saline Rest/fluids otc expectorant/DM for cough prn  Update if not starting to improve in a week or if worsening        Relevant Medications   amoxicillin-clavulanate (AUGMENTIN) 875-125 MG tablet   fluticasone (FLONASE) 50 MCG/ACT nasal spray     Endocrine   Hypothyroidism - Primary    Due for labs No clinical changes No exam changes Will need refill of levothyr (DAW) soon       Relevant Orders   TSH

## 2018-09-18 NOTE — Patient Instructions (Signed)
Drink fluids and get rest when can  Nasal saline spray is ok for congestion   Start flonase every day to open sinuses and ears  Take augmentin as directed for sinus infection   Update if not starting to improve in a week or if worsening    Lab for thyroid today

## 2018-09-18 NOTE — Assessment & Plan Note (Signed)
S/p 2 wk of uri Now ear and sinus pain with purulent drainage Cover with augmentin flonase ns  Saline Rest/fluids otc expectorant/DM for cough prn  Update if not starting to improve in a week or if worsening

## 2018-09-21 ENCOUNTER — Telehealth: Payer: Self-pay | Admitting: *Deleted

## 2018-09-21 ENCOUNTER — Encounter: Payer: Self-pay | Admitting: *Deleted

## 2018-09-21 MED ORDER — SYNTHROID 25 MCG PO TABS: ORAL_TABLET | ORAL | 11 refills | Status: DC

## 2018-09-21 NOTE — Telephone Encounter (Signed)
-----   Message from Abner Greenspan, MD sent at 09/20/2018  1:01 PM EST ----- No change needed in levothyroxine dose Please send in refills for year if needed

## 2018-10-05 ENCOUNTER — Telehealth: Payer: Self-pay | Admitting: Family Medicine

## 2018-10-05 NOTE — Telephone Encounter (Signed)
Rec'd from [facility] [place] forwarded 5 pages to Dr. Loura Pardon

## 2018-10-21 ENCOUNTER — Encounter: Payer: Self-pay | Admitting: Family Medicine

## 2019-03-24 ENCOUNTER — Telehealth: Payer: Self-pay | Admitting: Family Medicine

## 2019-03-24 NOTE — Telephone Encounter (Signed)
Best number [phone] Pt called to schedule cpx and wanted to know if she could switch from dr tower to [employer] to schedule??

## 2019-03-24 NOTE — Telephone Encounter (Signed)
That is fine with me if it is ok with Jackelyn Poling I will route this to her

## 2019-03-24 NOTE — Telephone Encounter (Signed)
That is fine with me.

## 2019-04-16 NOTE — Telephone Encounter (Signed)
8/28 pt aware

## 2019-06-04 ENCOUNTER — Ambulatory Visit: Payer: 59 | Admitting: Family Medicine

## 2019-06-04 ENCOUNTER — Other Ambulatory Visit: Payer: Self-pay

## 2019-06-04 ENCOUNTER — Encounter: Payer: Self-pay | Admitting: Family Medicine

## 2019-06-04 VITALS — BP 118/80 | HR 88 | Temp 99.0°F | Ht 64.0 in | Wt 145.2 lb

## 2019-06-04 DIAGNOSIS — Z0000 Encounter for general adult medical examination without abnormal findings: Secondary | ICD-10-CM | POA: Diagnosis not present

## 2019-06-04 DIAGNOSIS — E039 Hypothyroidism, unspecified: Secondary | ICD-10-CM | POA: Diagnosis not present

## 2019-06-04 DIAGNOSIS — N939 Abnormal uterine and vaginal bleeding, unspecified: Secondary | ICD-10-CM | POA: Diagnosis not present

## 2019-06-04 DIAGNOSIS — F411 Generalized anxiety disorder: Secondary | ICD-10-CM

## 2019-06-04 MED ORDER — ESCITALOPRAM OXALATE 10 MG PO TABS: 10.0000 mg | ORAL_TABLET | Freq: Every day | ORAL | 5 refills | Status: DC

## 2019-06-04 MED ORDER — ALPRAZOLAM 0.5 MG PO TABS: 0.5000 mg | ORAL_TABLET | Freq: Every evening | ORAL | 0 refills | Status: DC | PRN

## 2019-06-04 NOTE — Progress Notes (Signed)
Subjective:    Patient ID: Victoria Russell, female    DOB: September 17, 1986, 33 y.o.   MRN: [id number]  HPI Chief Complaint  Patient presents with  . Annual Exam    Wants to discuss anxiety.    This is a 33 year old female who presents today to transfer care from Dr. Glori Bickers to me.  She is also requesting CPE. 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 Kids are 4,6.  Her husband works alternating shifts which leaves a lot of childcare responsibilities to her.  She lives down the street from her parents who are sickly and require care.  She has noticed a significant increase in her anxiety since her mother's health declined in the last year.    Last CPE-patient regular GYN follow-up Pap- 11/10/2017, sees Dr. Tressia Danas Colonoscopy- 11/09/2014 Tdap- 10/19/2012 Flu- gets at work  [employer]- regular Dental- regular Exercise- treadmill  Increased anxiety- difficulty going asleep and staying asleep. Light sleeper. Has OCD tendencies, cleans. Has seen counselor in the past, this became too expensive for her. Mind races. Taking her husband's medication (alprazolam) occasionally with some improvement. Temporarily smoked marijuana but stopped due to concerns about possible positive drug test.   Past Medical History:  Diagnosis Date  . ADHD (attention deficit hyperactivity disorder)   . Anxiety    no meds  . Atypical moles    dysplasia  . Diverticulitis   . Dysmenorrhea   . History of kidney stones    2012, passed stones, no surgery required  . HLD (hyperlipidemia)    diet controlled  . Hx of varicella   . Hyperhidrosis   . Hypothyroidism   . Pregnancy induced hypertension    resolved with delivery of 01/2013 pregnancy  . Right ureteral stone 3/09  . Wolff-Parkinson-White (WPW) syndrome    history, no problems since age 80 yrs old   Past Surgical History:  Procedure Laterality Date  . CESAREAN SECTION N/A 01/29/2013   Procedure: CESAREAN SECTION;  Surgeon: Cyril Mourning, MD;  Location: [facility];   Service: Obstetrics;  Laterality: N/A;  . CESAREAN SECTION N/A 08/22/2014   Procedure: CESAREAN SECTION;  Surgeon: Cyril Mourning, MD;  Location: [facility];  Service: Obstetrics;  Laterality: N/A;  Repeat edc 08/29/14  . CYSTOSCOPY    . MOLE REMOVAL     dysplastic x 2 - right breast and right glut  . MYRINGOTOMY    . TONSILLECTOMY AND ADENOIDECTOMY    . WISDOM TOOTH EXTRACTION     Family History  Problem Relation Age of Onset  . [place] White syndrome Mother   . Hypothyroidism Mother   . Basal cell carcinoma Mother   . Cancer Mother        basal cell skin CA  . Colon polyps Mother   . Cancer Other        cervical CA  . Depression Father   . Colon polyps Father   . Diabetes Maternal Grandfather   . Cervical cancer Other        MGGM  . Cervical cancer Maternal Grandmother   . Cancer Maternal Grandmother        cervical CA  . Coronary artery disease Paternal Grandmother   . Heart disease Paternal Grandmother        CAD  . Other Sister        Dermoid cyst  . Diabetes Daughter    Social History   Tobacco Use  . Smoking status: Never Smoker  . Smokeless  tobacco: Never Used  Substance Use Topics  . Alcohol use: No    Alcohol/week: 0.0 standard drinks    Comment: Social  . Drug use: No      Review of Systems  Constitutional: Negative.   HENT: Negative.   Eyes: Negative.   Respiratory: Negative.   Cardiovascular: Negative.   Endocrine: Negative.   Genitourinary: Positive for menstrual problem. Negative for dyspareunia, dysuria, frequency and hematuria.  Musculoskeletal:       Some increased knee and hip pain with increased exercise.  She does not do any stretching.  Skin:       Has annual dermatology skin check.  Has had several precancerous lesions removed.  Allergic/Immunologic: Negative.   Neurological: Negative.   Hematological: Negative.   Psychiatric/Behavioral: Positive for sleep disturbance. The patient is nervous/anxious.        Objective:    Physical Exam Physical Exam  Constitutional: She is oriented to person, place, and time. She appears well-developed and well-nourished. No distress.  HENT:  Head: Normocephalic and atraumatic.  Right Ear: External ear normal.  Left Ear: External ear normal.  Nose: Nose normal.  Eyes: Conjunctivae are normal. Neck: Normal range of motion. Neck supple. No JVD present. No thyromegaly present.  Cardiovascular: Normal rate, regular rhythm, normal heart sounds and intact distal pulses.   Pulmonary/Chest: Effort normal and breath sounds normal. Right breast exhibits no inverted nipple, no mass, no nipple discharge, no skin change and no tenderness. Left breast exhibits no inverted nipple, no mass, no nipple discharge, no skin change and no tenderness. Breasts are symmetrical.  Abdominal: Soft. Bowel sounds are normal. She exhibits no distension and no mass. There is no tenderness. There is no rebound and no guarding.  Musculoskeletal: Normal range of motion. She exhibits no edema or tenderness.  Lymphadenopathy:    She has no cervical adenopathy.  Neurological: She is alert and oriented to person, place, and time. She has normal gait. Skin: Skin is warm and dry. She is not diaphoretic.  Psychiatric: She has a normal mood and affect. Her behavior is normal. Judgment and thought content normal.  Vitals reviewed.   BP 118/80 (BP Location: Left Arm, Patient Position: Sitting, Cuff Size: Normal)   Pulse 88   Temp 99 F (37.2 C) (Temporal)   Ht 5\' 4"  (1.626 m)   Wt 145 lb 4 oz (65.9 kg)   LMP 05/27/2019   SpO2 98%   BMI 24.93 kg/m  Wt Readings from Last 3 Encounters:  06/04/19 145 lb 4 oz (65.9 kg)  09/18/18 154 lb (69.9 kg)  09/24/17 162 lb 8 oz (73.7 kg)   GAD 7 : Generalized Anxiety Score 06/04/2019  Nervous, Anxious, on Edge 3  Control/stop worrying 3  Worry too much - different things 3  Trouble relaxing 1  Restless 3  Easily annoyed or irritable 1  Afraid - awful might happen 0   Total GAD 7 Score 14        Assessment & Plan:  1. Annual physical exam - Discussed and encouraged healthy lifestyle choices- adequate sleep, regular exercise, stress management and healthy food choices.   2. GAD (generalized anxiety disorder) -Discussed pharmacologic as well as nonpharmacologic treatment and expectations of medication -Provided written information regarding nonpharmacologic ways to cope with anxiety as well as contact for counseling - escitalopram (LEXAPRO) 10 MG tablet; Take 1 tablet (10 mg total) by mouth daily.  Dispense: 30 tablet; Refill: 5 - ALPRAZolam (XANAX) 0.5 MG tablet; Take 1 tablet (  0.5 mg total) by mouth at bedtime as needed for anxiety.  Dispense: 20 tablet; Refill: 0 -Agree to short-term prescription of alprazolam with no refills to use sparingly while her escitalopram starts working -Follow-up in 6 to 8 weeks with a virtual visit, sooner if symptoms worsen or intolerance to medication  3. Abnormal uterine bleeding (AUB) -She is interested in following up with her gynecologist to discuss tubal ligation and uterine ablation - CBC with Differential - Ferritin  4. Hypothyroidism, unspecified type - TSH   Clarene Reamer, FNP-BC  [facility]  06/04/2019 5:21 PM

## 2019-06-04 NOTE — Patient Instructions (Signed)
Good to see you today  I will notify you of blood test results next week  Recommend that you get in touch with your gynecologist to discuss your heavy periods and contraception  Please schedule a virtual visit with me for 6 to 8 weeks from now  Medication for depression and anxiety often takes 6-8 weeks to have a noticeable difference so stick with it. Also the best way for recovery is taking medication and seeing a therapist -- this is so important.      How to help anxiety and depression   1) Regular Exercise - walking, jogging, cycling, dancing, strength training - aiming for 150 minutes of exercise a week -- Yoga has been shown in research to reduce depression and anxiety -- with even just one hour long session per week -- Walk leisurely for 30 minutes every day  2)  Begin a Mindfulness/Meditation practice -- this can take a little as 3 minutes and is helpful for all kinds of mood issues -- You can find resources in books -- Or you can download apps like  -- Headspace App  -- Calm  -- Insignt Timer -- Stop, Breathe & Think   # With each of these Apps - you should decline the "start free trial" offer and as you search through the App should be able to access some of their free content. You can also chose to pay for the content if you find one that works well for you.    # Many of them also offer sleep specific content which may help with insomnia   3) Healthy Diet - Avoid fast foods and processed foods, eat mostly lean proteins, vegetables, fruits and whole grains -- Avoid or decrease Caffeine -- Avoid or decrease Alcohol -- Drink plenty of water, have a balanced diet -- Avoid cigarettes and marijuana (as well as other recreational drugs)   4) Consider contacting a professional therapist  - [place] [phone]      Health Maintenance, Female Adopting a healthy lifestyle and getting preventive care are important in promoting health and wellness. Ask  your health care provider about:  The right schedule for you to have regular tests and exams.  Things you can do on your own to prevent diseases and keep yourself healthy. What should I know about diet, weight, and exercise? Eat a healthy diet   Eat a diet that includes plenty of vegetables, fruits, low-fat dairy products, and lean protein.  Do not eat a lot of foods that are high in solid fats, added sugars, or sodium. Maintain a healthy weight Body mass index (BMI) is used to identify weight problems. It estimates body fat based on height and weight. Your health care provider can help determine your BMI and help you achieve or maintain a healthy weight. Get regular exercise Get regular exercise. This is one of the most important things you can do for your health. Most adults should:  Exercise for at least 150 minutes each week. The exercise should increase your heart rate and make you sweat (moderate-intensity exercise).  Do strengthening exercises at least twice a week. This is in addition to the moderate-intensity exercise.  Spend less time sitting. Even light physical activity can be beneficial. Watch cholesterol and blood lipids Have your blood tested for lipids and cholesterol at 32 years of age, then have this test every 5 years. Have your cholesterol levels checked more often if:  Your lipid or cholesterol levels are high.  You are  older than 32 years of age.  You are at high risk for heart disease. What should I know about cancer screening? Depending on your health history and family history, you may need to have cancer screening at various ages. This may include screening for:  Breast cancer.  Cervical cancer.  Colorectal cancer.  Skin cancer.  Lung cancer. What should I know about heart disease, diabetes, and high blood pressure? Blood pressure and heart disease  High blood pressure causes heart disease and increases the risk of stroke. This is more likely to  develop in people who have high blood pressure readings, are of African descent, or are overweight.  Have your blood pressure checked: ? Every 3-5 years if you are 27-72 years of age. ? Every year if you are 45 years old or older. Diabetes Have regular diabetes screenings. This checks your fasting blood sugar level. Have the screening done:  Once every three years after age 65 if you are at a normal weight and have a low risk for diabetes.  More often and at a younger age if you are overweight or have a high risk for diabetes. What should I know about preventing infection? Hepatitis B If you have a higher risk for hepatitis B, you should be screened for this virus. Talk with your health care provider to find out if you are at risk for hepatitis B infection. Hepatitis C Testing is recommended for:  Everyone born from [birth year] through 1964.  Anyone with known risk factors for hepatitis C. Sexually transmitted infections (STIs)  Get screened for STIs, including gonorrhea and chlamydia, if: ? You are sexually active and are younger than 31 years of age. ? You are older than 32 years of age and your health care provider tells you that you are at risk for this type of infection. ? Your sexual activity has changed since you were last screened, and you are at increased risk for chlamydia or gonorrhea. Ask your health care provider if you are at risk.  Ask your health care provider about whether you are at high risk for HIV. Your health care provider may recommend a prescription medicine to help prevent HIV infection. If you choose to take medicine to prevent HIV, you should first get tested for HIV. You should then be tested every 3 months for as long as you are taking the medicine. Pregnancy  If you are about to stop having your period (premenopausal) and you may become pregnant, seek counseling before you get pregnant.  Take 400 to 800 micrograms (mcg) of folic acid every day if you become  pregnant.  Ask for birth control (contraception) if you want to prevent pregnancy. Osteoporosis and menopause Osteoporosis is a disease in which the bones lose minerals and strength with aging. This can result in bone fractures. If you are 34 years old or older, or if you are at risk for osteoporosis and fractures, ask your health care provider if you should:  Be screened for bone loss.  Take a calcium or vitamin D supplement to lower your risk of fractures.  Be given hormone replacement therapy (HRT) to treat symptoms of menopause. Follow these instructions at home: Lifestyle  Do not use any products that contain nicotine or tobacco, such as cigarettes, e-cigarettes, and chewing tobacco. If you need help quitting, ask your health care provider.  Do not use street drugs.  Do not share needles.  Ask your health care provider for help if you need support or information  about quitting drugs. Alcohol use  Do not drink alcohol if: ? Your health care provider tells you not to drink. ? You are pregnant, may be pregnant, or are planning to become pregnant.  If you drink alcohol: ? Limit how much you use to 0-1 drink a day. ? Limit intake if you are breastfeeding.  Be aware of how much alcohol is in your drink. In the [place] one drink equals one 12 oz bottle of beer (355 mL), one 5 oz glass of wine (148 mL), or one 1 oz glass of hard liquor (44 mL). General instructions  Schedule regular health, dental, and eye exams.  Stay current with your vaccines.  Tell your health care provider if: ? You often feel depressed. ? You have ever been abused or do not feel safe at home. Summary  Adopting a healthy lifestyle and getting preventive care are important in promoting health and wellness.  Follow your health care provider's instructions about healthy diet, exercising, and getting tested or screened for diseases.  Follow your health care provider's instructions on monitoring your  cholesterol and blood pressure. This information is not intended to replace advice given to you by your health care provider. Make sure you discuss any questions you have with your health care provider. Document Released: 04/08/2011 Document Revised: 09/16/2018 Document Reviewed: 09/16/2018 Elsevier Patient Education  2020 [employer].

## 2019-06-05 LAB — FERRITIN: Ferritin: 21 ng/mL (ref 16–154)

## 2019-06-05 LAB — CBC WITH DIFFERENTIAL/PLATELET
Absolute Monocytes: 687 cells/uL (ref 200–950)
Basophils Absolute: 68 cells/uL (ref 0–200)
Basophils Relative: 1 %
Eosinophils Absolute: 61 cells/uL (ref 15–500)
Eosinophils Relative: 0.9 %
HCT: 43.1 % (ref 35.0–45.0)
Hemoglobin: 14.6 g/dL (ref 11.7–15.5)
Lymphs Abs: 2026 cells/uL (ref 850–3900)
MCH: 29.7 pg (ref 27.0–33.0)
MCHC: 33.9 g/dL (ref 32.0–36.0)
MCV: 87.6 fL (ref 80.0–100.0)
MPV: 11.3 fL (ref 7.5–12.5)
Monocytes Relative: 10.1 %
Neutro Abs: 3958 cells/uL (ref 1500–7800)
Neutrophils Relative %: 58.2 %
Platelets: 292 10*3/uL (ref 140–400)
RBC: 4.92 10*6/uL (ref 3.80–5.10)
RDW: 12.9 % (ref 11.0–15.0)
Total Lymphocyte: 29.8 %
WBC: 6.8 10*3/uL (ref 3.8–10.8)

## 2019-06-05 LAB — TSH: TSH: 1.59 mIU/L

## 2019-06-27 ENCOUNTER — Other Ambulatory Visit: Payer: Self-pay | Admitting: Family Medicine

## 2019-06-27 DIAGNOSIS — F411 Generalized anxiety disorder: Secondary | ICD-10-CM

## 2019-06-28 NOTE — Telephone Encounter (Signed)
Pharmacy sending request to switch Lexapro to 90 day supply for next refill. Is this ok? This was originally sent on 06/04/2019 for #30 with 5 refills

## 2019-07-13 ENCOUNTER — Encounter: Payer: Self-pay | Admitting: Family Medicine

## 2019-07-16 ENCOUNTER — Encounter: Payer: Self-pay | Admitting: Family Medicine

## 2019-07-16 ENCOUNTER — Other Ambulatory Visit: Payer: Self-pay

## 2019-07-16 ENCOUNTER — Ambulatory Visit (INDEPENDENT_AMBULATORY_CARE_PROVIDER_SITE_OTHER): Payer: 59 | Admitting: Family Medicine

## 2019-07-16 VITALS — BP 118/75 | HR 70 | Ht 64.0 in | Wt 135.0 lb

## 2019-07-16 DIAGNOSIS — F411 Generalized anxiety disorder: Secondary | ICD-10-CM

## 2019-07-16 DIAGNOSIS — F5104 Psychophysiologic insomnia: Secondary | ICD-10-CM | POA: Diagnosis not present

## 2019-07-16 MED ORDER — ALPRAZOLAM 0.5 MG PO TABS: 0.2500 mg | ORAL_TABLET | Freq: Every evening | ORAL | 0 refills | Status: DC | PRN

## 2019-07-16 MED ORDER — TRAZODONE HCL 50 MG PO TABS: 25.0000 mg | ORAL_TABLET | Freq: Every evening | ORAL | 3 refills | Status: DC | PRN

## 2019-07-16 NOTE — Progress Notes (Signed)
Virtual Visit via Video Note  I connected with Victoria Russell on 07/16/19 at 11:30 AM EDT by a video enabled telemedicine application and verified that I am speaking with the correct person using two identifiers.  Location: Patient: At work Provider: [facility]   I discussed the limitations of evaluation and management by telemedicine and the availability of in person appointments. The patient expressed understanding and agreed to proceed.  History of Present Illness: Chief Complaint  Patient presents with  . Follow-up    Tolerating Anxiety medication great! Feels that she still needs the Xanax from time to time - 1/2 tablet.      This is a 33 yo female who presents today for follow up of anxiety.  She was started on escitalopram 10 mg p.o. daily 2 months ago.  She reports that since starting, her anxiety has significantly decreased as well as her "nervous stomach."  She does continue to have insomnia and has been taking Xanax nightly.  She notices if she skips that she does not sleep.  She realizes that this is not a good long-term solution of her insomnia.  She is exercising 1 hour on her treadmill daily.  She is lost about 30 pounds in the last 6 months.  She feels that her weight loss has slowed down in the last 2 months as her anxiety has improved.  She denies any side effects of medication.  Past Medical History:  Diagnosis Date  . ADHD (attention deficit hyperactivity disorder)   . Anxiety    no meds  . Atypical moles    dysplasia  . Diverticulitis   . Dysmenorrhea   . History of kidney stones    2012, passed stones, no surgery required  . HLD (hyperlipidemia)    diet controlled  . Hx of varicella   . Hyperhidrosis   . Hypothyroidism   . Pregnancy induced hypertension    resolved with delivery of 01/2013 pregnancy  . Right ureteral stone 3/09  . Wolff-Parkinson-White (WPW) syndrome    history, no problems since age 38 yrs old   Past Surgical History:  Procedure  Laterality Date  . CESAREAN SECTION N/A 01/29/2013   Procedure: CESAREAN SECTION;  Surgeon: Cyril Mourning, MD;  Location: [facility];  Service: Obstetrics;  Laterality: N/A;  . CESAREAN SECTION N/A 08/22/2014   Procedure: CESAREAN SECTION;  Surgeon: Cyril Mourning, MD;  Location: [facility];  Service: Obstetrics;  Laterality: N/A;  Repeat edc 08/29/14  . CYSTOSCOPY    . MOLE REMOVAL     dysplastic x 2 - right breast and right glut  . MYRINGOTOMY    . TONSILLECTOMY AND ADENOIDECTOMY    . WISDOM TOOTH EXTRACTION     Family History  Problem Relation Age of Onset  . [place] White syndrome Mother   . Hypothyroidism Mother   . Basal cell carcinoma Mother   . Cancer Mother        basal cell skin CA  . Colon polyps Mother   . Cancer Other        cervical CA  . Depression Father   . Colon polyps Father   . Diabetes Maternal Grandfather   . Cervical cancer Other        MGGM  . Cervical cancer Maternal Grandmother   . Cancer Maternal Grandmother        cervical CA  . Coronary artery disease Paternal Grandmother   . Heart disease Paternal Grandmother  CAD  . Other Sister        Dermoid cyst  . Diabetes Daughter    Social History   Tobacco Use  . Smoking status: Never Smoker  . Smokeless tobacco: Never Used  Substance Use Topics  . Alcohol use: No    Alcohol/week: 0.0 standard drinks    Comment: Social  . Drug use: No    Observations/Objective: The patient is alert and answers questions appropriately.  Visible skin is unremarkable.  She is normally conversive without shortness of breath, wheeze or witnessed cough.  Her mood and affect are appropriate. BP 118/75 (BP Location: Left Arm, Patient Position: Sitting, Cuff Size: Normal)   Pulse 70   Ht 5\' 4"  (1.626 m)   Wt 135 lb (61.2 kg)   BMI 23.17 kg/m    Assessment and Plan: 1. GAD (generalized anxiety disorder) -Significantly improved with escitalopram 10 mg, continue current medication -Encouraged  continued exercise and other stress relief measures - ALPRAZolam (XANAX) 0.5 MG tablet; Take 0.5-1 tablets (0.25-0.5 mg total) by mouth at bedtime as needed for anxiety.  Dispense: 20 tablet; Refill: 0  2. Psychophysiological insomnia -Discussed use of alprazolam and how this is not ideal for daily use but should only be used rarely.  We will give her short-term refill and try trazodone for sleep - traZODone (DESYREL) 50 MG tablet; Take 0.5-1.5 tablets (25-75 mg total) by mouth at bedtime as needed for sleep.  Dispense: 45 tablet; Refill: 3  -Follow-up in 6 months Clarene Reamer, FNP-BC  [facility]  07/16/2019 11:47 AM   Follow Up Instructions: Visit recap sent to patient via my chart   I discussed the assessment and treatment plan with the patient. The patient was provided an opportunity to ask questions and all were answered. The patient agreed with the plan and demonstrated an understanding of the instructions.   The patient was advised to call back or seek an in-person evaluation if the symptoms worsen or if the condition fails to improve as anticipated.    Elby Beck, FNP

## 2019-07-18 ENCOUNTER — Encounter: Payer: Self-pay | Admitting: Family Medicine

## 2019-07-19 ENCOUNTER — Encounter: Payer: Self-pay | Admitting: Family Medicine

## 2019-07-19 NOTE — Telephone Encounter (Signed)
Please advise Debbie, thanks.  

## 2019-07-26 ENCOUNTER — Telehealth: Payer: Self-pay | Admitting: Family Medicine

## 2019-07-26 ENCOUNTER — Other Ambulatory Visit: Payer: Self-pay

## 2019-07-26 DIAGNOSIS — Z20822 Contact with and (suspected) exposure to covid-19: Secondary | ICD-10-CM

## 2019-07-26 NOTE — Telephone Encounter (Signed)
Noted  

## 2019-07-26 NOTE — Telephone Encounter (Signed)
Patient called today and stated that her and her children were exposed to someone who tested positive for covid. And wanted to be tested. She was advised of [facility] testing site and times.  Patient is going today to be tested.  Advised to self quarantine then she will be contacted when results come back to let her know what she should do next

## 2019-07-28 LAB — NOVEL CORONAVIRUS, NAA: SARS-CoV-2, NAA: NOT DETECTED

## 2019-08-02 ENCOUNTER — Encounter: Payer: Self-pay | Admitting: Family Medicine

## 2019-08-05 ENCOUNTER — Encounter: Payer: Self-pay | Admitting: Family Medicine

## 2019-08-07 ENCOUNTER — Other Ambulatory Visit: Payer: Self-pay | Admitting: Family Medicine

## 2019-08-07 DIAGNOSIS — F5104 Psychophysiologic insomnia: Secondary | ICD-10-CM

## 2019-08-09 NOTE — Telephone Encounter (Signed)
Pharmacy is requesting 90 day supply.  Ok to refill for #135?

## 2019-08-25 ENCOUNTER — Other Ambulatory Visit: Payer: Self-pay | Admitting: Family Medicine

## 2019-08-25 DIAGNOSIS — F411 Generalized anxiety disorder: Secondary | ICD-10-CM

## 2019-08-27 MED ORDER — ALPRAZOLAM 0.5 MG PO TABS: 0.2500 mg | ORAL_TABLET | Freq: Every evening | ORAL | 0 refills | Status: DC | PRN

## 2019-08-27 NOTE — Telephone Encounter (Signed)
Last OV 06/04/2019 for CPE Last filled 07/16/2019  #20 x o refills.   Please advise, thanks.

## 2019-09-07 HISTORY — PX: TUBAL LIGATION: SHX77

## 2019-09-07 HISTORY — PX: COMBINED LAPAROSCOPY W/ HYSTEROSCOPY: SUR299

## 2019-09-13 ENCOUNTER — Other Ambulatory Visit: Payer: Self-pay

## 2019-09-13 MED ORDER — SYNTHROID 25 MCG PO TABS: ORAL_TABLET | ORAL | 11 refills | Status: DC

## 2019-09-20 ENCOUNTER — Other Ambulatory Visit: Payer: Self-pay | Admitting: Obstetrics and Gynecology

## 2019-09-26 ENCOUNTER — Other Ambulatory Visit: Payer: Self-pay | Admitting: Family Medicine

## 2019-09-26 DIAGNOSIS — F411 Generalized anxiety disorder: Secondary | ICD-10-CM

## 2019-09-28 NOTE — Telephone Encounter (Signed)
Last OV 07/16/2019 Last refilled 08/27/2019 #20 x 0 refills  Please advise, thanks.

## 2019-09-29 MED ORDER — ALPRAZOLAM 0.5 MG PO TABS: 0.2500 mg | ORAL_TABLET | Freq: Every evening | ORAL | 0 refills | Status: DC | PRN

## 2019-10-08 HISTORY — PX: BREAST SURGERY: SHX581

## 2019-10-08 HISTORY — PX: BREAST ENHANCEMENT SURGERY: SHX7

## 2019-10-21 ENCOUNTER — Other Ambulatory Visit: Payer: Self-pay | Admitting: Family Medicine

## 2019-10-21 DIAGNOSIS — F411 Generalized anxiety disorder: Secondary | ICD-10-CM

## 2019-10-22 MED ORDER — ALPRAZOLAM 0.5 MG PO TABS: 0.2500 mg | ORAL_TABLET | Freq: Every evening | ORAL | 0 refills | Status: DC | PRN

## 2019-10-22 NOTE — Telephone Encounter (Signed)
Last filled on 09/29/2019 #20 with 0 refill LOV 07/16/2019  No future appointments made

## 2019-11-20 ENCOUNTER — Other Ambulatory Visit: Payer: Self-pay | Admitting: Family Medicine

## 2019-11-20 DIAGNOSIS — F411 Generalized anxiety disorder: Secondary | ICD-10-CM

## 2019-11-20 NOTE — Telephone Encounter (Signed)
Last written 10-22-19 #20 Last OV 07-16-19 No future OV  [facility]

## 2019-11-22 MED ORDER — ALPRAZOLAM 0.5 MG PO TABS: 0.2500 mg | ORAL_TABLET | Freq: Every evening | ORAL | 0 refills | Status: DC | PRN

## 2019-12-15 ENCOUNTER — Other Ambulatory Visit: Payer: Self-pay | Admitting: Family Medicine

## 2019-12-15 DIAGNOSIS — F411 Generalized anxiety disorder: Secondary | ICD-10-CM

## 2019-12-17 ENCOUNTER — Ambulatory Visit: Payer: 59 | Attending: Internal Medicine

## 2019-12-17 DIAGNOSIS — Z20822 Contact with and (suspected) exposure to covid-19: Secondary | ICD-10-CM

## 2019-12-17 MED ORDER — ALPRAZOLAM 0.5 MG PO TABS: 0.2500 mg | ORAL_TABLET | Freq: Every evening | ORAL | 0 refills | Status: DC | PRN

## 2019-12-17 NOTE — Telephone Encounter (Signed)
Last office visit 07/16/2019 for Psychophysiological insomnia.  Last refilled 11/22/2019 for #20 with no refills.  No future appointments.

## 2019-12-18 LAB — NOVEL CORONAVIRUS, NAA: SARS-CoV-2, NAA: NOT DETECTED

## 2020-01-31 ENCOUNTER — Other Ambulatory Visit: Payer: Self-pay | Admitting: Family Medicine

## 2020-01-31 DIAGNOSIS — F411 Generalized anxiety disorder: Secondary | ICD-10-CM

## 2020-02-02 MED ORDER — ALPRAZOLAM 0.5 MG PO TABS: 0.2500 mg | ORAL_TABLET | Freq: Every evening | ORAL | 0 refills | Status: DC | PRN

## 2020-02-02 NOTE — Telephone Encounter (Signed)
Rx was last refilled for 12/17/19 for #20 with 0 refills. Patient was last seen on 07/16/19 - and has no upcoming appts. Jackelyn Poling, is this ok to refill?

## 2020-03-01 ENCOUNTER — Other Ambulatory Visit: Payer: Self-pay | Admitting: Family Medicine

## 2020-03-01 DIAGNOSIS — F411 Generalized anxiety disorder: Secondary | ICD-10-CM

## 2020-03-02 NOTE — Telephone Encounter (Signed)
Rx was last refilled for 02/02/20 for #20 with 0 refills. Patient was last seen on 07/16/19... last CPE 06/04/2019 - and has no upcoming appts...Marland Kitchen please advise

## 2020-03-03 MED ORDER — ALPRAZOLAM 0.5 MG PO TABS: 0.2500 mg | ORAL_TABLET | Freq: Every evening | ORAL | 0 refills | Status: DC | PRN

## 2020-04-05 ENCOUNTER — Other Ambulatory Visit: Payer: Self-pay | Admitting: Family Medicine

## 2020-04-05 DIAGNOSIS — F411 Generalized anxiety disorder: Secondary | ICD-10-CM

## 2020-04-06 NOTE — Telephone Encounter (Signed)
Patient was last seen on 07/16/19 Last CPE 06/04/2019 No upcoming appts Last refilled 02/06/20 #20 x 0 refills.   Please advise, thanks.

## 2020-04-28 ENCOUNTER — Telehealth (INDEPENDENT_AMBULATORY_CARE_PROVIDER_SITE_OTHER): Payer: 59 | Admitting: Family Medicine

## 2020-04-28 ENCOUNTER — Encounter: Payer: Self-pay | Admitting: Family Medicine

## 2020-04-28 ENCOUNTER — Other Ambulatory Visit: Payer: Self-pay

## 2020-04-28 VITALS — Ht 64.0 in | Wt 118.0 lb

## 2020-04-28 DIAGNOSIS — Z2821 Immunization not carried out because of patient refusal: Secondary | ICD-10-CM

## 2020-04-28 DIAGNOSIS — F411 Generalized anxiety disorder: Secondary | ICD-10-CM

## 2020-04-28 MED ORDER — ALPRAZOLAM 0.5 MG PO TABS: 0.2500 mg | ORAL_TABLET | Freq: Every evening | ORAL | 1 refills | Status: DC | PRN

## 2020-04-28 NOTE — Progress Notes (Signed)
Virtual Visit via Video Note  I connected with Victoria Russell on 04/28/20 at 12:00 PM EDT by a video enabled telemedicine application and verified that I am speaking with the correct person using two identifiers.  Location: Patient: In her car Provider: [place] Persons participating in virtual visit: Patient and provider   I discussed the limitations of evaluation and management by telemedicine and the availability of in person appointments. The patient expressed understanding and agreed to proceed.  History of Present Illness: Chief Complaint  Patient presents with  . Medication Refill    Xanax   This is a 34 year old female who presents today via video visit for follow-up of generalized anxiety disorder, insomnia.  She reports that things are better for her and she is happy with her anxiety level on her S-Citalopram 10 mg daily.  She takes alprazolam 0.25 to 0.5 mg most nights for chronic insomnia.  She reports good relief with this.  She has had difficulty sleeping if she does not take.  Does not take during the day.  She feels that most of her stress and anxiety is situational.  Things are going well with her husband and children but something stresses her out such as the school schedule and sports.  She is exercising most days which she feels helps tremendously with her anxiety.  Covid vaccine-she has not been vaccinated and has no plans to get vaccinated in the near future.  She had uterine ablation with tubal ligation 12/20 and breast augmentation earlier this year.  She reports having some labs done prior to surgery and they were all normal.   Observations/Objective: Patient is alert and answers questions appropriately.  Visible skin is unremarkable.  Respirations are even and unlabored without increased work of breathing, audible wheeze or witnessed cough.  Mood and affect are appropriate. Ht 5\' 4"  (1.626 m)   Wt 118 lb (53.5 kg)   BMI 20.25 kg/m  Wt Readings from  Last 3 Encounters:  04/28/20 118 lb (53.5 kg)  07/16/19 135 lb (61.2 kg)  06/04/19 145 lb 4 oz (65.9 kg)   GAD 7 : Generalized Anxiety Score 04/28/2020 06/04/2019  Nervous, Anxious, on Edge 1 3  Control/stop worrying 0 3  Worry too much - different things 0 3  Trouble relaxing 3 1  Restless 3 3  Easily annoyed or irritable 0 1  Afraid - awful might happen 0 0  Total GAD 7 Score 7 14  Anxiety Difficulty Not difficult at all -    Depression screen [facility] 2/9 06/04/2019 07/07/2017  Decreased Interest 0 0  Down, Depressed, Hopeless 0 0  PHQ - 2 Score 0 0    Assessment and Plan: 1. GAD (generalized anxiety disorder) -Discussed whether or not she wanted to increase her escitalopram to 20 mg and she wants to stay on 10 mg at this time. -Encouraged her to continue nonmedication interventions for her anxiety, use alprazolam sparingly - ALPRAZolam (XANAX) 0.5 MG tablet; Take 0.5-1 tablets (0.25-0.5 mg total) by mouth at bedtime as needed for anxiety.  Dispense: 30 tablet; Refill: 1 -Follow-up in 6 months in person, will check labs at that time  2. COVID-19 virus vaccination refused -Discussed benefits of vaccination and encourage patient to continue to wear a mask in public if she is not vaccinated   Clarene Reamer, FNP-BC  [facility], [place]  04/28/2020 12:46 PM   Follow Up Instructions:    I discussed the assessment  and treatment plan with the patient. The patient was provided an opportunity to ask questions and all were answered. The patient agreed with the plan and demonstrated an understanding of the instructions.   The patient was advised to call back or seek an in-person evaluation if the symptoms worsen or if the condition fails to improve as anticipated.   Elby Beck, FNP

## 2020-05-31 ENCOUNTER — Encounter: Payer: Self-pay | Admitting: Family Medicine

## 2020-06-08 ENCOUNTER — Other Ambulatory Visit: Payer: Self-pay | Admitting: Family Medicine

## 2020-06-08 DIAGNOSIS — F411 Generalized anxiety disorder: Secondary | ICD-10-CM

## 2020-07-08 ENCOUNTER — Other Ambulatory Visit: Payer: Self-pay | Admitting: Family Medicine

## 2020-07-12 ENCOUNTER — Encounter: Payer: Self-pay | Admitting: Family Medicine

## 2020-07-12 ENCOUNTER — Other Ambulatory Visit: Payer: Self-pay | Admitting: Family Medicine

## 2020-07-12 DIAGNOSIS — F411 Generalized anxiety disorder: Secondary | ICD-10-CM

## 2020-07-12 NOTE — Telephone Encounter (Signed)
Last office visit 04/28/2020 for GAD.  Last refilled 04/28/2020 for #30 with 1 refill.  No future appointments.

## 2020-07-12 NOTE — Telephone Encounter (Signed)
See refill request.

## 2020-07-13 NOTE — Telephone Encounter (Signed)
Refill sent to patients pharmacy. 

## 2020-07-27 ENCOUNTER — Encounter: Payer: Self-pay | Admitting: Family Medicine

## 2020-08-06 ENCOUNTER — Other Ambulatory Visit: Payer: Self-pay | Admitting: Family Medicine

## 2020-08-07 NOTE — Telephone Encounter (Signed)
Last office visit 04/28/2020 for GAD.  Last refilled 07/11/2020 for #15 with no refills.  Last TSH 06/04/2019.  No future appointmetns. Ok to refill?

## 2020-08-30 ENCOUNTER — Other Ambulatory Visit: Payer: Self-pay | Admitting: Family Medicine

## 2020-08-30 NOTE — Telephone Encounter (Signed)
Pharmacy requests refill on: Synthroid 25 mcg   LAST REFILL: 08/07/2020 LAST OV: 07/16/2019 NEXT OV: Not Scheduled  PHARMACY: [id number] [place], [place]   Last TSH(06/04/2019)

## 2020-09-28 ENCOUNTER — Other Ambulatory Visit: Payer: Self-pay | Admitting: Family Medicine

## 2020-10-18 ENCOUNTER — Telehealth: Payer: Self-pay

## 2020-10-18 DIAGNOSIS — E039 Hypothyroidism, unspecified: Secondary | ICD-10-CM

## 2020-10-18 DIAGNOSIS — F411 Generalized anxiety disorder: Secondary | ICD-10-CM

## 2020-10-18 MED ORDER — ALPRAZOLAM 0.5 MG PO TABS: 0.2500 mg | ORAL_TABLET | Freq: Every evening | ORAL | 1 refills | Status: DC | PRN

## 2020-10-18 MED ORDER — SYNTHROID 25 MCG PO TABS: ORAL_TABLET | ORAL | 1 refills | Status: DC

## 2020-10-18 NOTE — Telephone Encounter (Signed)
Pt needs refill on xanax and synthroid [facility]. She scheduled a TOC appointment with Dr. Einar Pheasant on 12/25/20.

## 2020-10-18 NOTE — Telephone Encounter (Signed)
Name of Medication: Xanax [facility] of [facility] Last Fill or Written Date and Quantity: 07/13/20, # 35, 1RF Last Office Visit and Type: VV for GAD on 04/28/20 Next Office Visit and Type: TOC 12/25/20 with Dr. Einar Pheasant Last Controlled Substance Agreement Date: unable to find one Last UDS: unable to find one    Name of Medication: synthroid [facility] of [facility] Last Fill or Written Date and Quantity: 08/2820, #15, on script it was written as pt needs apt and lab work before additional fills  Last Office Visit and Type: VV for GAD on 04/28/20 Next Office Visit and Type:  TOC 12/25/20 with Dr. Einar Pheasant

## 2020-10-18 NOTE — Telephone Encounter (Signed)
Refills to pharmacy 

## 2020-12-09 ENCOUNTER — Other Ambulatory Visit: Payer: Self-pay | Admitting: Family Medicine

## 2020-12-09 DIAGNOSIS — E039 Hypothyroidism, unspecified: Secondary | ICD-10-CM

## 2020-12-18 ENCOUNTER — Other Ambulatory Visit: Payer: Self-pay

## 2020-12-18 DIAGNOSIS — F411 Generalized anxiety disorder: Secondary | ICD-10-CM

## 2020-12-18 MED ORDER — ESCITALOPRAM OXALATE 10 MG PO TABS: 10.0000 mg | ORAL_TABLET | Freq: Every day | ORAL | 1 refills | Status: DC

## 2020-12-18 NOTE — Telephone Encounter (Signed)
Pharmacy requests refill on: Escitalopram 10 mg   LAST REFILL: 06/13/2020 (Q-90, R-1) LAST OV: 04/28/2020 NEXT OV: 12/25/2020 PHARMACY: [facility] [place], [place]

## 2020-12-25 ENCOUNTER — Other Ambulatory Visit: Payer: Self-pay

## 2020-12-25 ENCOUNTER — Ambulatory Visit: Payer: 59 | Admitting: Family Medicine

## 2020-12-25 ENCOUNTER — Encounter: Payer: Self-pay | Admitting: Family Medicine

## 2020-12-25 VITALS — BP 112/78 | HR 57 | Temp 98.2°F | Ht 63.5 in | Wt 133.0 lb

## 2020-12-25 DIAGNOSIS — F411 Generalized anxiety disorder: Secondary | ICD-10-CM

## 2020-12-25 DIAGNOSIS — E782 Mixed hyperlipidemia: Secondary | ICD-10-CM | POA: Diagnosis not present

## 2020-12-25 DIAGNOSIS — E039 Hypothyroidism, unspecified: Secondary | ICD-10-CM | POA: Diagnosis not present

## 2020-12-25 DIAGNOSIS — G4700 Insomnia, unspecified: Secondary | ICD-10-CM | POA: Insufficient documentation

## 2020-12-25 DIAGNOSIS — F5104 Psychophysiologic insomnia: Secondary | ICD-10-CM | POA: Diagnosis not present

## 2020-12-25 MED ORDER — SYNTHROID 25 MCG PO TABS: ORAL_TABLET | ORAL | 1 refills | Status: DC

## 2020-12-25 MED ORDER — ALPRAZOLAM 0.5 MG PO TABS: 0.2500 mg | ORAL_TABLET | Freq: Every evening | ORAL | 5 refills | Status: DC | PRN

## 2020-12-25 NOTE — Patient Instructions (Addendum)
Anxiety - Therapy  Plan options 1) Increase lexapro to 15 mg (1.5 tablets) and take at night -- monitor for sleepiness  If plan 1 does not work then option 2  2) Continue Lexapro 10 mg and start Buspar -- call or mychart if you want to try this

## 2020-12-25 NOTE — Assessment & Plan Note (Signed)
No recent TSH will recheck. Cont synthroid 25 mcg

## 2020-12-25 NOTE — Assessment & Plan Note (Signed)
Failed trazodone, responding to xanax and zzquil. Will work on treating anxiety and then focus on stopping xanax. May need to try alternative sleep aid.

## 2020-12-25 NOTE — Assessment & Plan Note (Signed)
Lab Results  Component Value Date   CHOL 177 03/14/2010   HDL 69.20 03/14/2010   LDLCALC 87 03/14/2010   TRIG 102.0 03/14/2010   CHOLHDL 3 03/14/2010   Previously high and no recent labs. Will check today. Suspect weight loss will help

## 2020-12-25 NOTE — Progress Notes (Signed)
Subjective:     Victoria Russell is a 35 y.o. female presenting for Transitions Of Care     HPI   #Anxiety - "through the roof" - worse with periods - was started on lexapro last year - did try higher dose but this lead to being sleepy - has tried stopping the xanax but when dose she will panic attacks at work   Has been taking 0.5 mg or even 1 mg every day  Parents health is not good - caring for kids and parents and husband's health  #insomnia - taking benadryl, magnesium, xanax, zquil - failed trazodone -   Review of Systems   Social History   Tobacco Use  Smoking Status Never Smoker  Smokeless Tobacco Never Used        Objective:    BP Readings from Last 3 Encounters:  12/25/20 112/78  07/16/19 118/75  06/04/19 118/80   Wt Readings from Last 3 Encounters:  12/25/20 133 lb (60.3 kg)  04/28/20 118 lb (53.5 kg)  07/16/19 135 lb (61.2 kg)    BP 112/78   Pulse (!) 57   Temp 98.2 F (36.8 C) (Temporal)   Ht 5' 3.5" (1.613 m)   Wt 133 lb (60.3 kg)   LMP 12/25/2020 (Exact Date)   SpO2 100%   BMI 23.19 kg/m    Physical Exam Constitutional:      General: She is not in acute distress.    Appearance: She is well-developed. She is not diaphoretic.  HENT:     Right Ear: External ear normal.     Left Ear: External ear normal.     Nose: Nose normal.  Eyes:     Conjunctiva/sclera: Conjunctivae normal.  Cardiovascular:     Rate and Rhythm: Normal rate.  Pulmonary:     Effort: Pulmonary effort is normal.  Musculoskeletal:     Cervical back: Neck supple.  Skin:    General: Skin is warm and dry.     Capillary Refill: Capillary refill takes less than 2 seconds.  Neurological:     Mental Status: She is alert. Mental status is at baseline.  Psychiatric:        Mood and Affect: Mood normal.        Behavior: Behavior normal.           Assessment & Plan:   Problem List Items Addressed This Visit      Endocrine   Hypothyroidism    No  recent TSH will recheck. Cont synthroid 25 mcg      Relevant Medications   SYNTHROID 25 MCG tablet   Other Relevant Orders   TSH     Other   Hyperlipidemia - Primary    Lab Results  Component Value Date   CHOL 177 03/14/2010   HDL 69.20 03/14/2010   LDLCALC 87 03/14/2010   TRIG 102.0 03/14/2010   CHOLHDL 3 03/14/2010   Previously high and no recent labs. Will check today. Suspect weight loss will help      Relevant Orders   Comprehensive metabolic panel   Lipid panel   Generalized anxiety disorder    Initially controlled on lexapro 10 mg but has been needing more xanax (especially at night). Discussed trial of lexapro 15 mg at nighttime. If no improvement encouraged therapy - starting this week - and buspar.       Relevant Medications   ALPRAZolam (XANAX) 0.5 MG tablet   Other Relevant Orders   Comprehensive metabolic panel  Insomnia    Failed trazodone, responding to xanax and zzquil. Will work on treating anxiety and then focus on stopping xanax. May need to try alternative sleep aid.        Other Visit Diagnoses    GAD (generalized anxiety disorder)       Relevant Medications   ALPRAZolam (XANAX) 0.5 MG tablet       Return in about 2 months (around 02/24/2021).  [name], MD  This visit occurred during the SARS-CoV-2 public health emergency.  Safety protocols were in place, including screening questions prior to the visit, additional usage of staff PPE, and extensive cleaning of exam room while observing appropriate contact time as indicated for disinfecting solutions.

## 2020-12-25 NOTE — Assessment & Plan Note (Signed)
Initially controlled on lexapro 10 mg but has been needing more xanax (especially at night). Discussed trial of lexapro 15 mg at nighttime. If no improvement encouraged therapy - starting this week - and buspar.

## 2020-12-26 LAB — COMPREHENSIVE METABOLIC PANEL
ALT: 19 U/L (ref 0–35)
AST: 19 U/L (ref 0–37)
Albumin: 4.3 g/dL (ref 3.5–5.2)
Alkaline Phosphatase: 55 U/L (ref 39–117)
BUN: 16 mg/dL (ref 6–23)
CO2: 25 mEq/L (ref 19–32)
Calcium: 9.1 mg/dL (ref 8.4–10.5)
Chloride: 104 mEq/L (ref 96–112)
Creatinine, Ser: 0.94 mg/dL (ref 0.40–1.20)
GFR: 79.54 mL/min (ref >60.00)
Glucose, Bld: 83 mg/dL (ref 70–99)
Potassium: 4 mEq/L (ref 3.5–5.1)
Sodium: 138 mEq/L (ref 135–145)
Total Bilirubin: 0.5 mg/dL (ref 0.2–1.2)
Total Protein: 7.2 g/dL (ref 6.0–8.3)

## 2020-12-26 LAB — LIPID PANEL
Cholesterol: 169 mg/dL (ref 0–200)
HDL: 65.5 mg/dL (ref >39.00)
LDL Cholesterol: 87 mg/dL (ref 0–99)
NonHDL: 103.91
Total CHOL/HDL Ratio: 3
Triglycerides: 84 mg/dL (ref 0.0–149.0)
VLDL: 16.8 mg/dL (ref 0.0–40.0)

## 2020-12-26 LAB — TSH: TSH: 1.85 u[IU]/mL (ref 0.35–4.50)

## 2020-12-29 ENCOUNTER — Encounter: Payer: Self-pay | Admitting: Family Medicine

## 2021-06-14 ENCOUNTER — Other Ambulatory Visit: Payer: Self-pay | Admitting: Family Medicine

## 2021-06-14 DIAGNOSIS — E039 Hypothyroidism, unspecified: Secondary | ICD-10-CM

## 2021-06-14 DIAGNOSIS — F411 Generalized anxiety disorder: Secondary | ICD-10-CM

## 2021-06-19 NOTE — Telephone Encounter (Signed)
Pt scheduled for 9/19

## 2021-06-21 ENCOUNTER — Other Ambulatory Visit: Payer: Self-pay | Admitting: Family Medicine

## 2021-06-21 ENCOUNTER — Encounter: Payer: Self-pay | Admitting: Family Medicine

## 2021-06-21 DIAGNOSIS — F411 Generalized anxiety disorder: Secondary | ICD-10-CM

## 2021-06-22 MED ORDER — ALPRAZOLAM 0.5 MG PO TABS: 0.2500 mg | ORAL_TABLET | Freq: Every evening | ORAL | 0 refills | Status: DC | PRN

## 2021-06-25 ENCOUNTER — Telehealth: Payer: 59 | Admitting: Family Medicine

## 2021-06-25 ENCOUNTER — Ambulatory Visit: Payer: 59 | Admitting: Family Medicine

## 2021-06-25 VITALS — Wt 125.0 lb

## 2021-06-25 DIAGNOSIS — F5104 Psychophysiologic insomnia: Secondary | ICD-10-CM | POA: Diagnosis not present

## 2021-06-25 DIAGNOSIS — F411 Generalized anxiety disorder: Secondary | ICD-10-CM

## 2021-06-25 MED ORDER — ALPRAZOLAM 0.5 MG PO TABS: 0.5000 mg | ORAL_TABLET | Freq: Every evening | ORAL | 5 refills | Status: DC | PRN

## 2021-06-25 NOTE — Patient Instructions (Signed)
Try increasing the lexapro to 20 mg to see if this helps with anxiety and sleep.   If helping message and work to reduce xanax as able.

## 2021-06-25 NOTE — Assessment & Plan Note (Signed)
Increase lexapro 10>20 mg. Update in a few weeks if improving symptoms and will refill. Cont xanax as needed up to once daily.

## 2021-06-25 NOTE — Assessment & Plan Note (Signed)
Stable. Cont xanax. Discussed increase lexapro to see if this reduces need for xanax and improves anxiety.

## 2021-06-25 NOTE — Progress Notes (Signed)
    I connected with ARLIS FITZMAURICE on 06/25/21 at  2:20 PM EDT by video and verified that I am speaking with the correct person using two identifiers.   I discussed the limitations, risks, security and privacy concerns of performing an evaluation and management service by video and the availability of in person appointments. I also discussed with the patient that there may be a patient responsible charge related to this service. The patient expressed understanding and agreed to proceed.  Patient location: Home Provider Location: [place] Participants: [name] and Kathyrn Lass   Subjective:     Victoria Russell is a 35 y.o. female presenting for Medication Refill (xanax)     Medication Refill   #Anxiety - taking lexapro - still needing xanax every night - for sleep and anxiety  - takes 0.5 mg nightly - does have some daytime anxiety - redoing house and kids, parents    Review of Systems   Social History   Tobacco Use  Smoking Status Never  Smokeless Tobacco Never        Objective:   BP Readings from Last 3 Encounters:  12/25/20 112/78  07/16/19 118/75  06/04/19 118/80   Wt Readings from Last 3 Encounters:  06/25/21 125 lb (56.7 kg)  12/25/20 133 lb (60.3 kg)  04/28/20 118 lb (53.5 kg)   Wt 125 lb (56.7 kg)   BMI 21.80 kg/m   Physical Exam Constitutional:      Appearance: Normal appearance. She is not ill-appearing.  HENT:     Head: Normocephalic and atraumatic.     Right Ear: External ear normal.     Left Ear: External ear normal.  Eyes:     Conjunctiva/sclera: Conjunctivae normal.  Pulmonary:     Effort: Pulmonary effort is normal. No respiratory distress.  Neurological:     Mental Status: She is alert. Mental status is at baseline.  Psychiatric:        Mood and Affect: Mood normal.        Behavior: Behavior normal.        Thought Content: Thought content normal.        Judgment: Judgment normal.          Assessment  & Plan:   Problem List Items Addressed This Visit       Other   Generalized anxiety disorder - Primary    Increase lexapro 10>20 mg. Update in a few weeks if improving symptoms and will refill. Cont xanax as needed up to once daily.       Relevant Medications   ALPRAZolam (XANAX) 0.5 MG tablet   Insomnia    Stable. Cont xanax. Discussed increase lexapro to see if this reduces need for xanax and improves anxiety.       Other Visit Diagnoses     GAD (generalized anxiety disorder)       Relevant Medications   ALPRAZolam (XANAX) 0.5 MG tablet        Return in about 6 months (around 12/23/2021) for annual exam.  [name], MD

## 2021-07-25 ENCOUNTER — Other Ambulatory Visit: Payer: Self-pay | Admitting: Family Medicine

## 2021-07-25 DIAGNOSIS — F411 Generalized anxiety disorder: Secondary | ICD-10-CM

## 2021-07-26 ENCOUNTER — Encounter: Payer: Self-pay | Admitting: Family Medicine

## 2021-07-26 NOTE — Telephone Encounter (Signed)
Spoke with pt after talking with Lennette Bihari at [employer] [employer]; pt has available refills of alprazolam 0.5 mg and Lennette Bihari said would get ready for pick up later today. Pt notified and voiced understanding.Pt appreciative for call. Nothing further needed at this time.

## 2021-12-15 ENCOUNTER — Other Ambulatory Visit: Payer: Self-pay | Admitting: Family Medicine

## 2021-12-15 DIAGNOSIS — E039 Hypothyroidism, unspecified: Secondary | ICD-10-CM

## 2021-12-15 DIAGNOSIS — F411 Generalized anxiety disorder: Secondary | ICD-10-CM

## 2022-01-02 ENCOUNTER — Other Ambulatory Visit: Payer: Self-pay | Admitting: Family Medicine

## 2022-01-02 DIAGNOSIS — F411 Generalized anxiety disorder: Secondary | ICD-10-CM

## 2022-01-03 NOTE — Telephone Encounter (Signed)
Last filled on 06/25/21 #30 with 5 refills ?LOV-was VV 06/25/21 and no future appointments made ?

## 2022-01-04 NOTE — Telephone Encounter (Signed)
She needs an appointment for 6 month check in ?

## 2022-01-04 NOTE — Telephone Encounter (Signed)
Mychart sent to pt.

## 2022-01-07 ENCOUNTER — Telehealth (INDEPENDENT_AMBULATORY_CARE_PROVIDER_SITE_OTHER): Payer: 59 | Admitting: Family Medicine

## 2022-01-07 ENCOUNTER — Encounter: Payer: Self-pay | Admitting: Family Medicine

## 2022-01-07 VITALS — Wt 143.0 lb

## 2022-01-07 DIAGNOSIS — E039 Hypothyroidism, unspecified: Secondary | ICD-10-CM

## 2022-01-07 DIAGNOSIS — F411 Generalized anxiety disorder: Secondary | ICD-10-CM

## 2022-01-07 DIAGNOSIS — F5104 Psychophysiologic insomnia: Secondary | ICD-10-CM

## 2022-01-07 MED ORDER — ESCITALOPRAM OXALATE 10 MG PO TABS: 10.0000 mg | ORAL_TABLET | Freq: Every day | ORAL | 1 refills | Status: DC

## 2022-01-07 MED ORDER — ALPRAZOLAM 0.5 MG PO TABS: 0.5000 mg | ORAL_TABLET | Freq: Every evening | ORAL | 5 refills | Status: DC | PRN

## 2022-01-07 MED ORDER — SYNTHROID 25 MCG PO TABS: 12.5000 ug | ORAL_TABLET | Freq: Every day | ORAL | 1 refills | Status: DC

## 2022-01-07 NOTE — Assessment & Plan Note (Signed)
Stable, due for labs. Will check at next visit. Cont levothyroxine 12.5 mcg.  ?

## 2022-01-07 NOTE — Assessment & Plan Note (Signed)
Improved with taking Lexapro at night.  Continue to self to taper her Xanax with occasional half a tablet.  #30, 0.5 mg xanax refilled. Return 6 months ?

## 2022-01-07 NOTE — Progress Notes (Signed)
? ? ?  I connected with Victoria Russell on 01/07/22 at 12:00 PM EDT by video and verified that I am speaking with the correct person using two identifiers. ?  ?I discussed the limitations, risks, security and privacy concerns of performing an evaluation and management service by video and the availability of in person appointments. I also discussed with the patient that there may be a patient responsible charge related to this service. The patient expressed understanding and agreed to proceed. ? ?Patient location: Home ?Provider Location: [facility] ?Participants: [name] and NAZIFA TRINKA ? ? ?Subjective:  ? ?  ?Victoria Russell is a 36 y.o. female presenting for Medication Refill (xanax) ?  ? ? ?Medication Refill ? ? ?#Anxiety ?- on lexapro ?- no issues ?- sleep is good - much better ?- is taking the xanax 0.5  ?- taking 1/2 pill occasionally  ?- but sometimes does need a full pill  ? ?#Hypothyroidism ?- doing well  ? ?Review of Systems ? ? ?Social History  ? ?Tobacco Use  ?Smoking Status Never  ?Smokeless Tobacco Never  ? ? ? ?   ?Objective:  ? ?BP Readings from Last 3 Encounters:  ?12/25/20 112/78  ?07/16/19 118/75  ?06/04/19 118/80  ? ?Wt Readings from Last 3 Encounters:  ?01/07/22 143 lb (64.9 kg)  ?06/25/21 125 lb (56.7 kg)  ?12/25/20 133 lb (60.3 kg)  ? ?Wt 143 lb (64.9 kg)   BMI 24.93 kg/m?  ? ? ?Physical Exam ?Constitutional:   ?   Appearance: Normal appearance. She is not ill-appearing.  ?HENT:  ?   Head: Normocephalic and atraumatic.  ?   Right Ear: External ear normal.  ?   Left Ear: External ear normal.  ?Eyes:  ?   Conjunctiva/sclera: Conjunctivae normal.  ?Pulmonary:  ?   Effort: Pulmonary effort is normal. No respiratory distress.  ?Neurological:  ?   Mental Status: She is alert. Mental status is at baseline.  ?Psychiatric:     ?   Mood and Affect: Mood normal.     ?   Behavior: Behavior normal.     ?   Thought Content: Thought content normal.     ?   Judgment: Judgment normal.   ? ? ? ? ? ? ?   ?Assessment & Plan:  ? ?Problem List Items Addressed This Visit   ? ?  ? Endocrine  ? Hypothyroidism  ?  Stable, due for labs. Will check at next visit. Cont levothyroxine 12.5 mcg.  ?  ?  ? Relevant Medications  ? SYNTHROID 25 MCG tablet  ?  ? Other  ? Generalized anxiety disorder - Primary  ?  Improved, needing xanax less often. Cont lexapro 10 mg ?  ?  ? Relevant Medications  ? ALPRAZolam (XANAX) 0.5 MG tablet  ? escitalopram (LEXAPRO) 10 MG tablet  ? Insomnia  ?  Improved with taking Lexapro at night.  Continue to self to taper her Xanax with occasional half a tablet.  #30, 0.5 mg xanax refilled. Return 6 months ?  ?  ? ?Other Visit Diagnoses   ? ? GAD (generalized anxiety disorder)      ? Relevant Medications  ? ALPRAZolam (XANAX) 0.5 MG tablet  ? escitalopram (LEXAPRO) 10 MG tablet  ? ?  ? ? ? ?Return in about 6 months (around 07/09/2022) for annual. ? ?[name], MD ? ?

## 2022-01-07 NOTE — Assessment & Plan Note (Signed)
Improved, needing xanax less often. Cont lexapro 10 mg ?

## 2022-03-26 ENCOUNTER — Encounter: Payer: Self-pay | Admitting: Family Medicine

## 2022-04-26 ENCOUNTER — Ambulatory Visit: Payer: 59 | Admitting: Family Medicine

## 2022-04-26 VITALS — BP 100/70 | HR 58 | Temp 97.4°F | Ht 64.0 in | Wt 152.0 lb

## 2022-04-26 DIAGNOSIS — Z0000 Encounter for general adult medical examination without abnormal findings: Secondary | ICD-10-CM

## 2022-04-26 DIAGNOSIS — R5383 Other fatigue: Secondary | ICD-10-CM | POA: Diagnosis not present

## 2022-04-26 DIAGNOSIS — E039 Hypothyroidism, unspecified: Secondary | ICD-10-CM

## 2022-04-26 DIAGNOSIS — R635 Abnormal weight gain: Secondary | ICD-10-CM

## 2022-04-26 DIAGNOSIS — F411 Generalized anxiety disorder: Secondary | ICD-10-CM | POA: Diagnosis not present

## 2022-04-26 LAB — LIPID PANEL
Cholesterol: 216 mg/dL — ABNORMAL HIGH (ref 0–200)
HDL: 76.4 mg/dL (ref >39.00)
LDL Cholesterol: 123 mg/dL — ABNORMAL HIGH (ref 0–99)
NonHDL: 139.65
Total CHOL/HDL Ratio: 3
Triglycerides: 85 mg/dL (ref 0.0–149.0)
VLDL: 17 mg/dL (ref 0.0–40.0)

## 2022-04-26 LAB — CBC
HCT: 42.2 % (ref 36.0–46.0)
Hemoglobin: 14.2 g/dL (ref 12.0–15.0)
MCHC: 33.8 g/dL (ref 30.0–36.0)
MCV: 92.3 fl (ref 78.0–100.0)
Platelets: 217 10*3/uL (ref 150.0–400.0)
RBC: 4.57 Mil/uL (ref 3.87–5.11)
RDW: 12.8 % (ref 11.5–15.5)
WBC: 6.2 10*3/uL (ref 4.0–10.5)

## 2022-04-26 LAB — COMPREHENSIVE METABOLIC PANEL
ALT: 13 U/L (ref 0–35)
AST: 15 U/L (ref 0–37)
Albumin: 4.3 g/dL (ref 3.5–5.2)
Alkaline Phosphatase: 60 U/L (ref 39–117)
BUN: 15 mg/dL (ref 6–23)
CO2: 26 mEq/L (ref 19–32)
Calcium: 9 mg/dL (ref 8.4–10.5)
Chloride: 106 mEq/L (ref 96–112)
Creatinine, Ser: 0.79 mg/dL (ref 0.40–1.20)
GFR: 97.08 mL/min (ref >60.00)
Glucose, Bld: 80 mg/dL (ref 70–99)
Potassium: 4.2 mEq/L (ref 3.5–5.1)
Sodium: 140 mEq/L (ref 135–145)
Total Bilirubin: 0.4 mg/dL (ref 0.2–1.2)
Total Protein: 7.5 g/dL (ref 6.0–8.3)

## 2022-04-26 LAB — VITAMIN D 25 HYDROXY (VIT D DEFICIENCY, FRACTURES): VITD: 52.79 ng/mL (ref 30.00–100.00)

## 2022-04-26 LAB — TSH: TSH: 1.81 u[IU]/mL (ref 0.35–5.50)

## 2022-04-26 LAB — VITAMIN B12: Vitamin B-12: 1094 pg/mL — ABNORMAL HIGH (ref 211–911)

## 2022-04-26 MED ORDER — BUPROPION HCL ER (XL) 150 MG PO TB24: 150.0000 mg | ORAL_TABLET | Freq: Every day | ORAL | 0 refills | Status: DC

## 2022-04-26 NOTE — Progress Notes (Signed)
Annual Exam   Chief Complaint:  Chief Complaint  Patient presents with   Annual Exam    History of Present Illness:  Ms. Victoria Russell is a 36 y.o. [id number] who LMP was No LMP recorded. Patient has had an ablation., presents today for her annual examination.    #Anxiety - has been trying to taper the lexapro - has been feeling nervous - anxiety gets high - has gained 30+ lbs   Nutrition/Lifestyle Diet: trying to do keto and macros Exercise: working out regularly  She does get adequate calcium and Vitamin D in her diet.  Social History   Tobacco Use  Smoking Status Never  Smokeless Tobacco Never   Social History   Substance and Sexual Activity  Alcohol Use Yes   Alcohol/week: 0.0 standard drinks of alcohol   Comment: once a month   Social History   Substance and Sexual Activity  Drug Use Yes   Types: Marijuana   Comment: Once a week     Safety The patient wears seatbelts: yes.     The patient feels safe at home and in their relationships: yes.  General Health Dentist in the last year: Yes Eye doctor: yes  Menstrual Her menses are regular - ablation and tubes  GYN She is single partner, contraception - tubal ligation.     Cervical Cancer Screening (Age 18-65) Last Pap:  2021 - needs to see GYN  Family History of Breast Cancer: mom at 67 yo Family History of Ovarian Cancer: no    Weight Wt Readings from Last 3 Encounters:  04/26/22 152 lb (68.9 kg)  01/07/22 143 lb (64.9 kg)  06/25/21 125 lb (56.7 kg)   Patient has normal BMI  BMI Readings from Last 1 Encounters:  04/26/22 26.09 kg/m     Chronic disease screening Blood pressure monitoring:  BP Readings from Last 3 Encounters:  04/26/22 100/70  12/25/20 112/78  07/16/19 118/75     Lipid Monitoring: Indication for screening: age >24, obesity, diabetes, family hx, CV risk factors.  Lipid screening: Yes  Lab Results  Component Value Date   CHOL 169 12/25/2020   HDL 65.50  12/25/2020   LDLCALC 87 12/25/2020   TRIG 84.0 12/25/2020   CHOLHDL 3 12/25/2020     Diabetes Screening: age >4, overweight, family hx, PCOS, hx of gestational diabetes, at risk ethnicity, elevated blood pressure >135/80.  Diabetes Screening screening: Yes  No results found for: "HGBA1C"    Past Medical History:  Diagnosis Date   ADHD (attention deficit hyperactivity disorder)    Anxiety    no meds   Atypical moles    dysplasia   Diverticulitis    Dysmenorrhea    History of kidney stones    2012, passed stones, no surgery required   HLD (hyperlipidemia)    diet controlled   Hx of varicella    Hyperhidrosis    Hypothyroidism    Palpitations 06/14/2011   Pregnancy induced hypertension    resolved with delivery of 01/2013 pregnancy   RENAL CALCULUS, HX OF 12/16/2008   Qualifier: Diagnosis of  By: Glori Bickers MD, Carmell Austria    Right ureteral stone 3/09   Wolff-Parkinson-White (WPW) syndrome    history, no problems since age 27 yrs old    Past Surgical History:  Procedure Laterality Date   BREAST SURGERY  2021   CESAREAN SECTION N/A 01/29/2013   Procedure: CESAREAN SECTION;  Surgeon: Cyril Mourning, MD;  Location: [facility];  Service: Obstetrics;  Laterality: N/A;   CESAREAN SECTION N/A 08/22/2014   Procedure: CESAREAN SECTION;  Surgeon: Cyril Mourning, MD;  Location: [facility];  Service: Obstetrics;  Laterality: N/A;  Repeat edc 08/29/14   CYSTOSCOPY     MOLE REMOVAL     dysplastic x 2 - right breast and right glut   MYRINGOTOMY     TONSILLECTOMY AND ADENOIDECTOMY     TUBAL LIGATION Bilateral 09/2019   with uterine ablation   WISDOM TOOTH EXTRACTION      Prior to Admission medications   Medication Sig Start Date End Date Taking? Authorizing Provider  ALPRAZolam Duanne Moron) 0.5 MG tablet Take 1 tablet (0.5 mg total) by mouth at bedtime as needed for anxiety. 01/07/22  Yes [name], MD  escitalopram (LEXAPRO) 10 MG tablet Take 1 tablet (10 mg total) by mouth daily. 01/07/22   Yes [name], MD  SYNTHROID 25 MCG tablet Take 0.5 tablets (12.5 mcg total) by mouth daily. 01/07/22  Yes [name], MD    No Known Allergies  Gynecologic History: No LMP recorded. Patient has had an ablation.  Obstetric History: G2P2002  Social History   Socioeconomic History   Marital status: Married    Spouse name: Lovena Le   Number of children: 2   Years of education: Producer, television/film/video   Highest education level: Not on file  Occupational History   Occupation: Ship broker    Employer: [employer]  Tobacco Use   Smoking status: Never   Smokeless tobacco: Never  Vaping Use   Vaping Use: Never used  Substance and Sexual Activity   Alcohol use: Yes    Alcohol/week: 0.0 standard drinks of alcohol    Comment: once a month   Drug use: Yes    Types: Marijuana    Comment: Once a week   Sexual activity: Yes    Birth control/protection: Surgical  Other Topics Concern   Not on file  Social History Narrative   12/25/20   From: the area   Living: with Lovena Le (2011) and 2 children   Work: English as a second language teacher for [employer]      Family: 2 children - Presenter, broadcasting (2014) and Merleen Nicely (2015)      Enjoys: not a lot time, rest alone in quiet, play sports with kids - soccer and softball      Exercise: treadmill 45 minutes per day, running with the kids too   Diet: healthy diet      Safety   Seat belts: Yes    Guns: Yes  and secure   Safe in relationships: Yes    Social Determinants of Health   Financial Resource Strain: Not on file  Food Insecurity: Not on file  Transportation Needs: Not on file  Physical Activity: Not on file  Stress: Not on file  Social Connections: Not on file  Intimate Partner Violence: Not on file    Family History  Problem Relation Age of Onset   Yves Dill Parkinson White syndrome Mother    Hypothyroidism Mother    Basal cell carcinoma Mother    Colon polyps Mother    Breast cancer Mother 56   Depression Father    Colon polyps Father     Diabetes Maternal Grandfather    Cervical cancer Maternal Grandmother    Coronary artery disease Paternal Grandmother    Heart disease Paternal Grandmother        CAD   Other Sister        Dermoid cyst  Review of Systems  Constitutional:  Negative for chills and fever.  HENT:  Negative for congestion and sore throat.   Eyes:  Negative for blurred vision and double vision.  Respiratory:  Negative for shortness of breath.   Cardiovascular:  Negative for chest pain.  Gastrointestinal:  Negative for heartburn, nausea and vomiting.  Genitourinary: Negative.   Musculoskeletal: Negative.  Negative for myalgias.  Skin:  Negative for rash.  Neurological:  Negative for dizziness and headaches.  Endo/Heme/Allergies:  Does not bruise/bleed easily.  Psychiatric/Behavioral:  Negative for depression. The patient is nervous/anxious.      Physical Exam BP 100/70   Pulse (!) 58   Temp (!) 97.4 F (36.3 C) (Temporal)   Ht '5\' 4"'$  (1.626 m)   Wt 152 lb (68.9 kg)   SpO2 100%   BMI 26.09 kg/m    BP Readings from Last 3 Encounters:  04/26/22 100/70  12/25/20 112/78  07/16/19 118/75    Wt Readings from Last 3 Encounters:  04/26/22 152 lb (68.9 kg)  01/07/22 143 lb (64.9 kg)  06/25/21 125 lb (56.7 kg)     Physical Exam Constitutional:      General: She is not in acute distress.    Appearance: She is well-developed. She is not diaphoretic.  HENT:     Head: Normocephalic and atraumatic.     Right Ear: External ear normal.     Left Ear: External ear normal.     Nose: Nose normal.  Eyes:     General: No scleral icterus.    Extraocular Movements: Extraocular movements intact.     Conjunctiva/sclera: Conjunctivae normal.  Cardiovascular:     Rate and Rhythm: Normal rate and regular rhythm.     Heart sounds: No murmur heard. Pulmonary:     Effort: Pulmonary effort is normal. No respiratory distress.     Breath sounds: Normal breath sounds. No wheezing.  Abdominal:     General:  Bowel sounds are normal. There is no distension.     Palpations: Abdomen is soft. There is no mass.     Tenderness: There is no abdominal tenderness. There is no guarding or rebound.  Musculoskeletal:        General: Normal range of motion.     Cervical back: Neck supple.  Lymphadenopathy:     Cervical: No cervical adenopathy.  Skin:    General: Skin is warm and dry.     Capillary Refill: Capillary refill takes less than 2 seconds.  Neurological:     Mental Status: She is alert and oriented to person, place, and time.     Deep Tendon Reflexes: Reflexes normal.  Psychiatric:        Mood and Affect: Mood normal.        Behavior: Behavior normal.       Results:    04/26/2022   10:07 AM 01/07/2022   11:12 AM 12/25/2020    2:49 PM  Depression screen PHQ 2/9  Decreased Interest 0 0 0  Down, Depressed, Hopeless 1 0 1  PHQ - 2 Score 1 0 1  Altered sleeping 3 0 3  Tired, decreased energy '3 3 3  '$ Change in appetite 3 0 1  Feeling bad or failure about yourself  1 0 0  Trouble concentrating 1 0 1  Moving slowly or fidgety/restless 3 0 2  Suicidal thoughts 0 0 0  PHQ-9 Score '15 3 11  '$ Difficult doing work/chores Somewhat difficult Not difficult at all Somewhat difficult  Assessment: 36 y.o. G83P2002 female here for routine annual examination.  Plan: Problem List Items Addressed This Visit       Endocrine   Hypothyroidism    Last thyroid was at goal, but its been more than 1 year and she has noticed significant weight gain.  Recheck thyroid.  Continue levothyroxine 12.5 mcg.      Relevant Orders   TSH     Other   Generalized anxiety disorder    She has been trying to taper Lexapro due to concern for weight gain.  With worsening anxiety symptoms.  Unclear if this is withdrawal or underlying disease, discussed starting Wellbutrin if she works on taper.  With ultimate plan to possibly take her off of Wellbutrin once she successfully gets off Lexapro.  She will follow-up in  approximately 6 weeks if she is needing additional support with her taper      Relevant Medications   buPROPion (WELLBUTRIN XL) 150 MG 24 hr tablet   Other Visit Diagnoses     Annual physical exam    -  Primary   Other fatigue       Relevant Orders   Comprehensive metabolic panel   Lipid panel   CBC   Vitamin D, 25-hydroxy   Vitamin B12   Weight gain       Relevant Orders   Comprehensive metabolic panel   Lipid panel   CBC   Vitamin D, 25-hydroxy   Vitamin B12        Screening: -- Blood pressure screen normal -- cholesterol screening: will obtain -- Weight screening: overweight: continue to monitor -- Diabetes Screening: will obtain -- Nutrition: encouraged healthy diet   Psych -- Depression screening (PHQ-9):  Flowsheet Row Office Visit from 04/26/2022 in [place] at [facility]  PHQ-9 Total Score 15        Safety -- tobacco screening: not using -- alcohol screening:  low-risk usage. -- no evidence of domestic violence or intimate partner violence.   Cancer Screening -- pap smear not collected per ASCCP guidelines -- family history of breast cancer screening: done. not at high risk.   Immunizations Immunization History  Administered Date(s) Administered   Hpv-Unspecified 04/07/2007   Influenza Split 06/24/2012   Influenza Whole 10/19/2007   Influenza-Unspecified 11/15/2020   Moderna Sars-Covid-2 Vaccination 10/18/2020, 11/15/2020   Td 01/05/2006   Tdap 10/19/2012    -- flu vaccine up to date -- TDAP q10 years up to date -- Covid-19 Vaccine up to date  Encouraged regular vision and dental screening. Encouraged healthy exercise and diet.   [name]

## 2022-04-26 NOTE — Patient Instructions (Signed)
Anxiety - start wellbutrin 150 mg  - Decrease lexapro to 5 mg  It can take 1-2 weeks for others 3-4 weeks - see how you feel - if withdrawal symptoms improving then you can decrease the dose  Update if you feel you need a 5 mg tablet to be able to try a 2.5 mg dose

## 2022-04-26 NOTE — Assessment & Plan Note (Signed)
She has been trying to taper Lexapro due to concern for weight gain.  With worsening anxiety symptoms.  Unclear if this is withdrawal or underlying disease, discussed starting Wellbutrin if she works on taper.  With ultimate plan to possibly take her off of Wellbutrin once she successfully gets off Lexapro.  She will follow-up in approximately 6 weeks if she is needing additional support with her taper

## 2022-04-26 NOTE — Assessment & Plan Note (Signed)
Last thyroid was at goal, but its been more than 1 year and she has noticed significant weight gain.  Recheck thyroid.  Continue levothyroxine 12.5 mcg.

## 2022-04-29 ENCOUNTER — Encounter: Payer: Self-pay | Admitting: Family Medicine

## 2022-04-30 ENCOUNTER — Encounter: Payer: Self-pay | Admitting: Family Medicine

## 2022-05-07 ENCOUNTER — Encounter: Payer: Self-pay | Admitting: Family Medicine

## 2022-05-09 ENCOUNTER — Telehealth: Payer: 59 | Admitting: Family Medicine

## 2022-05-09 ENCOUNTER — Encounter: Payer: Self-pay | Admitting: Family Medicine

## 2022-05-09 VITALS — Wt 153.0 lb

## 2022-05-09 DIAGNOSIS — F411 Generalized anxiety disorder: Secondary | ICD-10-CM | POA: Diagnosis not present

## 2022-05-09 DIAGNOSIS — F429 Obsessive-compulsive disorder, unspecified: Secondary | ICD-10-CM | POA: Diagnosis not present

## 2022-05-09 MED ORDER — VENLAFAXINE HCL ER 75 MG PO CP24: 75.0000 mg | ORAL_CAPSULE | Freq: Every day | ORAL | 1 refills | Status: DC

## 2022-05-09 NOTE — Progress Notes (Signed)
I connected with Victoria Russell on 05/09/22 at 12:00 PM EDT by video and verified that I am speaking with the correct person using two identifiers.   I discussed the limitations, risks, security and privacy concerns of performing an evaluation and management service by video and the availability of in person appointments. I also discussed with the patient that there may be a patient responsible charge related to this service. The patient expressed understanding and agreed to proceed.  Patient location: Home Provider Location: [place] Participants: Victoria Russell and Kathyrn Lass   Subjective:     Victoria Russell is a 36 y.o. female presenting for Follow-up (anxiety)     HPI  #Anxiety - Started wellbutrin 150 mg - stopped wellbutrin b/c her anxiety was severe  - saw therapist and was advise that she remain on medication that won't involve weight gain  - wants to be on medication - gained 50 lbs with lexapro  Review of Systems   Social History   Tobacco Use  Smoking Status Never  Smokeless Tobacco Never        Objective:   BP Readings from Last 3 Encounters:  04/26/22 100/70  12/25/20 112/78  07/16/19 118/75   Wt Readings from Last 3 Encounters:  05/09/22 153 lb (69.4 kg)  04/26/22 152 lb (68.9 kg)  01/07/22 143 lb (64.9 kg)   Wt 153 lb (69.4 kg)   BMI 26.26 kg/m    Physical Exam Constitutional:      Appearance: Normal appearance. She is not ill-appearing.  HENT:     Head: Normocephalic and atraumatic.     Right Ear: External ear normal.     Left Ear: External ear normal.  Eyes:     Conjunctiva/sclera: Conjunctivae normal.  Pulmonary:     Effort: Pulmonary effort is normal. No respiratory distress.  Neurological:     Mental Status: She is alert. Mental status is at baseline.  Psychiatric:        Mood and Affect: Mood normal.        Behavior: Behavior normal.        Thought Content: Thought content normal.        Judgment:  Judgment normal.         05/09/2022   11:48 AM 04/26/2022   10:07 AM 01/07/2022   11:13 AM 06/25/2021    2:20 PM  GAD 7 : Generalized Anxiety Score  Nervous, Anxious, on Edge '3 3 1 2  '$ Control/stop worrying '3 3 1 3  '$ Worry too much - different things '3 3 3 3  '$ Trouble relaxing '3 3 3 3  '$ Restless '3 3 3 3  '$ Easily annoyed or irritable 3 3 0 2  Afraid - awful might happen 3 3 0 0  Total GAD 7 Score '21 21 11 16  '$ Anxiety Difficulty Extremely difficult Very difficult Not difficult at all Not difficult at all         Assessment & Plan:   Problem List Items Addressed This Visit       Other   Generalized anxiety disorder - Primary    Anxiety is poorly controlled, in the setting of trying to change medications.  Wellbutrin made anxiety worse, she is unable to come off of Lexapro despite the weight gain due to worsening anxiety. Discussed switching to Effexor as this will have improvement in anxiety and OCD and is less likely to cause weight gain.  She is working out regularly and following  a keto diet and working with a trainer and has not been on been able to lose weight.  Do think the medication is major factor in her weight gain.  She will update in a week or 2 to consider dose increase to 150 mg daily.  And in 6 weeks for reassessment.      Relevant Medications   venlafaxine XR (EFFEXOR-XR) 75 MG 24 hr capsule   Obsessive-compulsive disorder    Stop Lexapro, start Effexor 75 mg.  Continue therapy.      Relevant Medications   venlafaxine XR (EFFEXOR-XR) 75 MG 24 hr capsule     Return in about 6 weeks (around 06/20/2022).  Victoria Noe, MD

## 2022-05-09 NOTE — Assessment & Plan Note (Signed)
Anxiety is poorly controlled, in the setting of trying to change medications.  Wellbutrin made anxiety worse, she is unable to come off of Lexapro despite the weight gain due to worsening anxiety. Discussed switching to Effexor as this will have improvement in anxiety and OCD and is less likely to cause weight gain.  She is working out regularly and following a keto diet and working with a Clinical research associate and has not been on been able to lose weight.  Do think the medication is major factor in her weight gain.  She will update in a week or 2 to consider dose increase to 150 mg daily.  And in 6 weeks for reassessment.

## 2022-05-09 NOTE — Assessment & Plan Note (Signed)
Stop Lexapro, start Effexor 75 mg.  Continue therapy.

## 2022-05-14 ENCOUNTER — Encounter: Payer: Self-pay | Admitting: Family Medicine

## 2022-05-14 DIAGNOSIS — F411 Generalized anxiety disorder: Secondary | ICD-10-CM

## 2022-05-14 MED ORDER — ALPRAZOLAM 0.5 MG PO TABS: 0.5000 mg | ORAL_TABLET | Freq: Three times a day (TID) | ORAL | 0 refills | Status: DC | PRN

## 2022-05-14 MED ORDER — ALPRAZOLAM 0.5 MG PO TABS: 0.5000 mg | ORAL_TABLET | Freq: Every evening | ORAL | 0 refills | Status: DC | PRN

## 2022-05-14 NOTE — Addendum Note (Signed)
Addended by: [name] on: 05/14/2022 01:31 PM   Modules accepted: Orders

## 2022-05-26 ENCOUNTER — Encounter: Payer: Self-pay | Admitting: Family Medicine

## 2022-05-26 DIAGNOSIS — F411 Generalized anxiety disorder: Secondary | ICD-10-CM

## 2022-05-27 MED ORDER — VENLAFAXINE HCL ER 150 MG PO CP24: 150.0000 mg | ORAL_CAPSULE | Freq: Every day | ORAL | 0 refills | Status: DC

## 2022-06-17 ENCOUNTER — Ambulatory Visit: Payer: 59 | Admitting: Family Medicine

## 2022-07-06 ENCOUNTER — Other Ambulatory Visit: Payer: Self-pay | Admitting: Family Medicine

## 2022-07-06 DIAGNOSIS — F429 Obsessive-compulsive disorder, unspecified: Secondary | ICD-10-CM

## 2022-07-06 DIAGNOSIS — F411 Generalized anxiety disorder: Secondary | ICD-10-CM

## 2022-08-05 ENCOUNTER — Other Ambulatory Visit: Payer: Self-pay

## 2022-08-05 DIAGNOSIS — E039 Hypothyroidism, unspecified: Secondary | ICD-10-CM

## 2022-08-05 MED ORDER — SYNTHROID 25 MCG PO TABS: 12.5000 ug | ORAL_TABLET | Freq: Every day | ORAL | 0 refills | Status: DC

## 2022-08-19 ENCOUNTER — Other Ambulatory Visit: Payer: Self-pay

## 2022-08-19 DIAGNOSIS — F411 Generalized anxiety disorder: Secondary | ICD-10-CM

## 2022-08-19 MED ORDER — VENLAFAXINE HCL ER 150 MG PO CP24: 150.0000 mg | ORAL_CAPSULE | Freq: Every day | ORAL | 0 refills | Status: DC

## 2022-08-27 ENCOUNTER — Telehealth: Payer: Self-pay

## 2022-08-27 DIAGNOSIS — F411 Generalized anxiety disorder: Secondary | ICD-10-CM

## 2022-08-27 NOTE — Telephone Encounter (Signed)
Has TOC with Matt on 09/04/22 Fax from pharmacy for Alprazolam refill Last VV on 05/09/22 Last filled on 05/14/22 #30 with 0 refill

## 2022-09-02 ENCOUNTER — Other Ambulatory Visit: Payer: Self-pay | Admitting: Nurse Practitioner

## 2022-09-02 DIAGNOSIS — F411 Generalized anxiety disorder: Secondary | ICD-10-CM

## 2022-09-02 MED ORDER — ALPRAZOLAM 0.5 MG PO TABS: 0.5000 mg | ORAL_TABLET | Freq: Every evening | ORAL | 0 refills | Status: DC | PRN

## 2022-09-02 NOTE — Telephone Encounter (Signed)
I continued the script how it was which was 0.'5mg'$  one a day. She has an appointment with me in 2 days and we can discuss further

## 2022-09-04 ENCOUNTER — Ambulatory Visit: Payer: 59 | Admitting: Nurse Practitioner

## 2022-09-04 ENCOUNTER — Encounter: Payer: Self-pay | Admitting: Nurse Practitioner

## 2022-09-04 VITALS — BP 130/84 | HR 65 | Temp 98.0°F | Resp 14 | Ht 64.0 in | Wt 158.0 lb

## 2022-09-04 DIAGNOSIS — N946 Dysmenorrhea, unspecified: Secondary | ICD-10-CM | POA: Diagnosis not present

## 2022-09-04 DIAGNOSIS — F411 Generalized anxiety disorder: Secondary | ICD-10-CM

## 2022-09-04 DIAGNOSIS — E039 Hypothyroidism, unspecified: Secondary | ICD-10-CM | POA: Diagnosis not present

## 2022-09-04 DIAGNOSIS — Z7689 Persons encountering health services in other specified circumstances: Secondary | ICD-10-CM

## 2022-09-04 DIAGNOSIS — E7800 Pure hypercholesterolemia, unspecified: Secondary | ICD-10-CM | POA: Diagnosis not present

## 2022-09-04 DIAGNOSIS — F5105 Insomnia due to other mental disorder: Secondary | ICD-10-CM

## 2022-09-04 DIAGNOSIS — F429 Obsessive-compulsive disorder, unspecified: Secondary | ICD-10-CM

## 2022-09-04 DIAGNOSIS — F99 Mental disorder, not otherwise specified: Secondary | ICD-10-CM

## 2022-09-04 DIAGNOSIS — E559 Vitamin D deficiency, unspecified: Secondary | ICD-10-CM

## 2022-09-04 MED ORDER — BUSPIRONE HCL 5 MG PO TABS: 5.0000 mg | ORAL_TABLET | Freq: Two times a day (BID) | ORAL | 0 refills | Status: DC

## 2022-09-04 NOTE — Assessment & Plan Note (Signed)
Not currently on any supplementation last vitamin D level within normal limits.

## 2022-09-04 NOTE — Assessment & Plan Note (Signed)
Patient has been on Lexapro and Wellbutrin in the past.  Currently on venlafaxine.  States when she gets super anxious.  CD will kick in and she will clean and [name].  States that she has stayed up for 2 days previous has never had a evaluation for other disorder such as bipolar.

## 2022-09-04 NOTE — Assessment & Plan Note (Signed)
Patient currently maintained on Synthroid 25 mcg.  Last TSH within normal limits.  Continue medication as prescribed

## 2022-09-04 NOTE — Assessment & Plan Note (Signed)
Patient was maintained on Lexapro but gained quite a bit of weight and cannot get it off so they switched agents to venlafaxine.  She is titrated up to 150 mg does well but it makes her sleepy per patient report.  She also tried adjunct with Wellbutrin but did not tolerate it.  Patient has tried trazodone in the past and failed.  We will start her on buspirone 5 mg twice daily continue her on alprazolam 0.5 mg daily as needed with goals of weaning her off entirely.  We can always consider mirtazapine in the future if needed may be Seroquel to

## 2022-09-04 NOTE — Patient Instructions (Signed)
Nice to see you today We will continue the medications as is We will add on the buspar to help with the anxiety I want to see you in 4 weeks to recheck

## 2022-09-04 NOTE — Progress Notes (Signed)
Established Patient Office Visit  Subjective   Patient ID: EBONEE STOBER, female    DOB: 22-Jun-1986  Age: 36 y.o. MRN: [id number]  Chief Complaint  Patient presents with   Transfer of Care   Medication Management    Has been taking Xanax 0.5 mg 2 tablets once daily per patient was discussed with Dr Einar Pheasant    HPI  Hypothyroidism: Does well with the Synthroid medication.  Patient's last TSH was within normal limits.  Patient has not had an ultrasound done of her thyroid thus far in life.  Anxiety: States that she was attempting to be weaned down on alprazolam. States that she was on lexapro in the past and gained weight. States that she is seen by a therapist and was going once a week and is now sees 2 times a month. States that she feels that it is helpful Effexor helps but does make the patient sleepy. Anxiety worse at night  States that she does have OCD and will clean all the time up to 1 am in the morning. States that she is having a heard time going to sleep.   Review of Systems  Constitutional:  Negative for chills and fever.  Respiratory:  Negative for shortness of breath.   Cardiovascular:  Negative for chest pain and palpitations.  Gastrointestinal:  Negative for abdominal pain, blood in stool, diarrhea, nausea and vomiting.       BM daily   Neurological:  Negative for tingling and headaches.  Psychiatric/Behavioral:  Negative for hallucinations and suicidal ideas.       Objective:     BP 130/84   Pulse 65   Temp 98 F (36.7 C)   Resp 14   Ht '5\' 4"'$  (1.626 m)   Wt 158 lb (71.7 kg)   SpO2 99%   BMI 27.12 kg/m    Physical Exam Vitals and nursing note reviewed.  Constitutional:      Appearance: Normal appearance.  HENT:     Right Ear: Tympanic membrane, ear canal and external ear normal.     Left Ear: Tympanic membrane, ear canal and external ear normal.     Mouth/Throat:     Mouth: Mucous membranes are moist.     Pharynx: Oropharynx is clear.  Eyes:      Extraocular Movements: Extraocular movements intact.     Pupils: Pupils are equal, round, and reactive to light.     Comments: Wears glasses   Neck:     Thyroid: No thyroid mass, thyromegaly or thyroid tenderness.  Cardiovascular:     Rate and Rhythm: Normal rate and regular rhythm.     Heart sounds: Normal heart sounds.  Pulmonary:     Effort: Pulmonary effort is normal.     Breath sounds: Normal breath sounds.  Musculoskeletal:     Right lower leg: No edema.     Left lower leg: No edema.  Lymphadenopathy:     Cervical: No cervical adenopathy.  Skin:    General: Skin is warm.  Neurological:     General: No focal deficit present.     Mental Status: She is alert.     Deep Tendon Reflexes:     Reflex Scores:      Bicep reflexes are 2+ on the right side and 2+ on the left side.      Patellar reflexes are 2+ on the right side and 2+ on the left side.    Comments: Bilateral upper and lower extremity strength 5/5  No results found for any visits on 09/04/22.    The ASCVD Risk score (Arnett DK, et al., 2019) failed to calculate for the following reasons:   The 2019 ASCVD risk score is only valid for ages 30 to 72    Assessment & Plan:   Problem List Items Addressed This Visit       Endocrine   Hypothyroidism    Patient currently maintained on Synthroid 25 mcg.  Last TSH within normal limits.  Continue medication as prescribed        Genitourinary   DYSMENORRHEA     Other   Hyperlipidemia    LDL above goal continue healthy lifestyle modifications.      Vitamin D deficiency    Not currently on any supplementation last vitamin D level within normal limits.      Generalized anxiety disorder    Patient was maintained on Lexapro but gained quite a bit of weight and cannot get it off so they switched agents to venlafaxine.  She is titrated up to 150 mg does well but it makes her sleepy per patient report.  She also tried adjunct with Wellbutrin but did not  tolerate it.  Patient has tried trazodone in the past and failed.  We will start her on buspirone 5 mg twice daily continue her on alprazolam 0.5 mg daily as needed with goals of weaning her off entirely.  We can always consider mirtazapine in the future if needed may be Seroquel to      Relevant Medications   busPIRone (BUSPAR) 5 MG tablet   Insomnia    Feel this is secondary to patient's anxiety disorder.  Patient has tried trazodone in the past and failed.  Currently maintained on venlafaxine and will add on BuSpar.  Can always consider mirtazapine if patient is not having good relief and having difficulty sleeping but patient's not a big fan of medicines causing weight gain that is why she switch from Lexapro.      Obsessive-compulsive disorder    Patient has been on Lexapro and Wellbutrin in the past.  Currently on venlafaxine.  States when she gets super anxious.  CD will kick in and she will clean and [name].  States that she has stayed up for 2 days previous has never had a evaluation for other disorder such as bipolar.      Relevant Medications   busPIRone (BUSPAR) 5 MG tablet   Other Visit Diagnoses     Encounter to establish care    -  Primary       Return in about 4 weeks (around 10/02/2022) for GAD.    Romilda Garret, NP

## 2022-09-04 NOTE — Assessment & Plan Note (Signed)
Feel this is secondary to patient's anxiety disorder.  Patient has tried trazodone in the past and failed.  Currently maintained on venlafaxine and will add on BuSpar.  Can always consider mirtazapine if patient is not having good relief and having difficulty sleeping but patient's not a big fan of medicines causing weight gain that is why she switch from Lexapro.

## 2022-09-04 NOTE — Assessment & Plan Note (Signed)
LDL above goal continue healthy lifestyle modifications.

## 2022-09-22 ENCOUNTER — Encounter: Payer: Self-pay | Admitting: Nurse Practitioner

## 2022-09-22 DIAGNOSIS — F411 Generalized anxiety disorder: Secondary | ICD-10-CM

## 2022-09-23 MED ORDER — BUSPIRONE HCL 5 MG PO TABS: ORAL_TABLET | ORAL | 2 refills | Status: DC

## 2022-10-05 ENCOUNTER — Other Ambulatory Visit: Payer: Self-pay | Admitting: Nurse Practitioner

## 2022-10-05 DIAGNOSIS — F411 Generalized anxiety disorder: Secondary | ICD-10-CM

## 2022-10-18 ENCOUNTER — Other Ambulatory Visit: Payer: Self-pay | Admitting: Nurse Practitioner

## 2022-10-18 DIAGNOSIS — F411 Generalized anxiety disorder: Secondary | ICD-10-CM

## 2022-11-01 ENCOUNTER — Other Ambulatory Visit: Payer: Self-pay | Admitting: Family

## 2022-11-01 DIAGNOSIS — E039 Hypothyroidism, unspecified: Secondary | ICD-10-CM

## 2022-11-06 ENCOUNTER — Other Ambulatory Visit: Payer: Self-pay | Admitting: Nurse Practitioner

## 2022-11-06 ENCOUNTER — Encounter: Payer: Self-pay | Admitting: Nurse Practitioner

## 2022-11-06 DIAGNOSIS — F411 Generalized anxiety disorder: Secondary | ICD-10-CM

## 2022-11-06 DIAGNOSIS — E039 Hypothyroidism, unspecified: Secondary | ICD-10-CM

## 2022-11-06 MED ORDER — ALPRAZOLAM 0.5 MG PO TABS: 0.5000 mg | ORAL_TABLET | Freq: Every day | ORAL | 0 refills | Status: DC | PRN

## 2022-11-06 MED ORDER — SYNTHROID 25 MCG PO TABS: 12.5000 ug | ORAL_TABLET | Freq: Every day | ORAL | 0 refills | Status: DC

## 2022-11-06 NOTE — Telephone Encounter (Signed)
Please advise on refill. Patient has been advised she needs follow up visit, based on 11/23 OV.

## 2022-11-07 ENCOUNTER — Telehealth (INDEPENDENT_AMBULATORY_CARE_PROVIDER_SITE_OTHER): Payer: 59 | Admitting: Nurse Practitioner

## 2022-11-07 VITALS — Ht 64.0 in | Wt 150.0 lb

## 2022-11-07 DIAGNOSIS — F411 Generalized anxiety disorder: Secondary | ICD-10-CM | POA: Diagnosis not present

## 2022-11-07 DIAGNOSIS — F5104 Psychophysiologic insomnia: Secondary | ICD-10-CM | POA: Diagnosis not present

## 2022-11-07 HISTORY — PX: COSMETIC SURGERY: SHX468

## 2022-11-07 NOTE — Assessment & Plan Note (Signed)
Patient currently maintained on venlafaxine 150 mg and buspirone 15 mg daily and as needed alprazolam.  She is not having any trouble sleeping currently continue regimen as stated before

## 2022-11-07 NOTE — Progress Notes (Signed)
Patient ID: Victoria Russell, female    DOB: [birth year], 37 y.o.   MRN: [id number]  Virtual visit completed through [place], a video enabled telemedicine application. Due to national recommendations of social distancing due to COVID-19, a virtual visit is felt to be most appropriate for this patient at this time. Reviewed limitations, risks, security and privacy concerns of performing a virtual visit and the availability of in person appointments. I also reviewed that there may be a patient responsible charge related to this service. The patient agreed to proceed.   Patient location: home Provider location: [place] [facility], office Persons participating in this virtual visit: patient, provider   If any vitals were documented, they were collected by patient at home unless specified below.    Ht '5\' 4"'$  (1.626 m)   Wt 150 lb (68 kg)   BMI 25.75 kg/m    CC: GAD Subjective:   HPI: Victoria Russell is a 37 y.o. female presenting on 11/07/2022 for Medication Management (Wants to discuss meds)    Medication management: Patient was seen by me on 09/04/2022.  Patient was taking Xanax 0.5 mg twice daily per conversation with previous primary care at office visit patient was maintained on Lexapro but gained a bit of weight so they switched her to venlafaxine.  She is at 150 mg states the medication can make her sleepy she tried Wellbutrin in the past without good benefit.  We did start her on buspirone 5 mg twice daily and continued her on alprazolam 0.5 mg daily as needed with a goal of weaning her off entirely also consider mirtazapine in the future also Seroquel is a possibility but both were metabolic drugs.  Patient is taking buspirone 5 mg 1 a day and 2 at night she did message me on 09/22/2022 and notify me of that. States that she is getting good relief with buspar. States that she will use the 0.'5mg'$  approx every other night   States that she is having no trouble sleeping. She did switch  the effexor to evening dosing and that has helped  430-5am get up and goes to bed around 10.   Relevant past medical, surgical, family and social history reviewed and updated as indicated. Interim medical history since our last visit reviewed. Allergies and medications reviewed and updated. Outpatient Medications Prior to Visit  Medication Sig Dispense Refill   ALPRAZolam (XANAX) 0.5 MG tablet Take 1 tablet (0.5 mg total) by mouth daily as needed for anxiety. 30 tablet 0   busPIRone (BUSPAR) 5 MG tablet TAKE 1 TABLET IN THE MORNING AND TAKE 2 TABLETS IN THE EVENING 270 tablet 1   SYNTHROID 25 MCG tablet Take 0.5 tablets (12.5 mcg total) by mouth daily. 45 tablet 0   venlafaxine XR (EFFEXOR XR) 150 MG 24 hr capsule Take 1 capsule (150 mg total) by mouth daily with breakfast. Will need to be seen in office for more refills. 90 capsule 0   nitrofurantoin, macrocrystal-monohydrate, (MACROBID) 100 MG capsule Take 100 mg by mouth 2 (two) times daily.     No facility-administered medications prior to visit.     Per HPI unless specifically indicated in ROS section below Review of Systems  Psychiatric/Behavioral:  Negative for hallucinations, sleep disturbance and suicidal ideas.    Objective:  Ht '5\' 4"'$  (1.626 m)   Wt 150 lb (68 kg)   BMI 25.75 kg/m   Wt Readings from Last 3 Encounters:  11/07/22 150 lb (68 kg)  09/04/22  158 lb (71.7 kg)  05/09/22 153 lb (69.4 kg)       Physical exam: Gen: alert, NAD, not ill appearing Pulm: speaks in complete sentences without increased work of breathing Psych: normal mood, normal thought content      Results for orders placed or performed in visit on 04/26/22  Comprehensive metabolic panel  Result Value Ref Range   Sodium 140 135 - 145 mEq/L   Potassium 4.2 3.5 - 5.1 mEq/L   Chloride 106 96 - 112 mEq/L   CO2 26 19 - 32 mEq/L   Glucose, Bld 80 70 - 99 mg/dL   BUN 15 6 - 23 mg/dL   Creatinine, Ser 0.79 0.40 - 1.20 mg/dL   Total Bilirubin 0.4  0.2 - 1.2 mg/dL   Alkaline Phosphatase 60 39 - 117 U/L   AST 15 0 - 37 U/L   ALT 13 0 - 35 U/L   Total Protein 7.5 6.0 - 8.3 g/dL   Albumin 4.3 3.5 - 5.2 g/dL   GFR 97.08 >60.00 mL/min   Calcium 9.0 8.4 - 10.5 mg/dL  Lipid panel  Result Value Ref Range   Cholesterol 216 (H) 0 - 200 mg/dL   Triglycerides 85.0 0.0 - 149.0 mg/dL   HDL 76.40 >39.00 mg/dL   VLDL 17.0 0.0 - 40.0 mg/dL   LDL Cholesterol 123 (H) 0 - 99 mg/dL   Total CHOL/HDL Ratio 3    NonHDL 139.65   TSH  Result Value Ref Range   TSH 1.81 0.35 - 5.50 uIU/mL  CBC  Result Value Ref Range   WBC 6.2 4.0 - 10.5 K/uL   RBC 4.57 3.87 - 5.11 Mil/uL   Platelets 217.0 150.0 - 400.0 K/uL   Hemoglobin 14.2 12.0 - 15.0 g/dL   HCT 42.2 36.0 - 46.0 %   MCV 92.3 78.0 - 100.0 fl   MCHC 33.8 30.0 - 36.0 g/dL   RDW 12.8 11.5 - 15.5 %  Vitamin D, 25-hydroxy  Result Value Ref Range   VITD 52.79 30.00 - 100.00 ng/mL  Vitamin B12  Result Value Ref Range   Vitamin B-12 1,094 (H) 211 - 911 pg/mL   Assessment & Plan:   Generalized anxiety disorder Assessment & Plan: Patient currently maintained on venlafaxine 150 mg and BuSpar 15 mg daily.  Tolerating medication well.  Having good relief of her symptoms.  She is using alprazolam 0.5 mg as needed states approximate every other day.  Did inform her next refill we will do 0.25 mg daily in efforts to try to wean off the medication entirely.  Patient not having any HI/SI/AVH.   Psychophysiological insomnia Assessment & Plan: Patient currently maintained on venlafaxine 150 mg and buspirone 15 mg daily and as needed alprazolam.  She is not having any trouble sleeping currently continue regimen as stated before      I discussed the assessment and treatment plan with the patient. The patient was provided an opportunity to ask questions and all were answered. The patient agreed with the plan and demonstrated an understanding of the instructions. The patient was advised to call back or seek  an in-person evaluation if the symptoms worsen or if the condition fails to improve as anticipated.  Follow up plan: Return in about 6 months (around 05/08/2023) for CPE and Labs.  Romilda Garret, NP

## 2022-11-07 NOTE — Assessment & Plan Note (Signed)
Patient currently maintained on venlafaxine 150 mg and BuSpar 15 mg daily.  Tolerating medication well.  Having good relief of her symptoms.  She is using alprazolam 0.5 mg as needed states approximate every other day.  Did inform her next refill we will do 0.25 mg daily in efforts to try to wean off the medication entirely.  Patient not having any HI/SI/AVH.

## 2022-11-17 ENCOUNTER — Other Ambulatory Visit: Payer: Self-pay | Admitting: Family

## 2022-11-17 DIAGNOSIS — F411 Generalized anxiety disorder: Secondary | ICD-10-CM

## 2022-11-21 ENCOUNTER — Encounter: Payer: Self-pay | Admitting: Nurse Practitioner

## 2022-11-21 DIAGNOSIS — F411 Generalized anxiety disorder: Secondary | ICD-10-CM

## 2022-11-21 MED ORDER — VENLAFAXINE HCL ER 150 MG PO CP24: 150.0000 mg | ORAL_CAPSULE | Freq: Every day | ORAL | 1 refills | Status: DC

## 2022-12-15 ENCOUNTER — Other Ambulatory Visit: Payer: Self-pay | Admitting: Nurse Practitioner

## 2022-12-15 DIAGNOSIS — F411 Generalized anxiety disorder: Secondary | ICD-10-CM

## 2022-12-17 MED ORDER — ALPRAZOLAM 0.5 MG PO TABS: 0.5000 mg | ORAL_TABLET | Freq: Every day | ORAL | 0 refills | Status: DC | PRN

## 2022-12-30 ENCOUNTER — Encounter: Payer: Self-pay | Admitting: Nurse Practitioner

## 2023-01-02 ENCOUNTER — Ambulatory Visit: Payer: 59 | Admitting: Nurse Practitioner

## 2023-01-26 ENCOUNTER — Other Ambulatory Visit: Payer: Self-pay | Admitting: Nurse Practitioner

## 2023-01-26 DIAGNOSIS — F411 Generalized anxiety disorder: Secondary | ICD-10-CM

## 2023-01-27 MED ORDER — ALPRAZOLAM 0.5 MG PO TABS: 0.5000 mg | ORAL_TABLET | Freq: Every day | ORAL | 0 refills | Status: DC | PRN

## 2023-02-04 ENCOUNTER — Other Ambulatory Visit: Payer: Self-pay | Admitting: Family

## 2023-02-04 DIAGNOSIS — E039 Hypothyroidism, unspecified: Secondary | ICD-10-CM

## 2023-03-08 ENCOUNTER — Other Ambulatory Visit: Payer: Self-pay | Admitting: Nurse Practitioner

## 2023-03-08 DIAGNOSIS — F411 Generalized anxiety disorder: Secondary | ICD-10-CM

## 2023-03-11 MED ORDER — ALPRAZOLAM 0.5 MG PO TABS: 0.5000 mg | ORAL_TABLET | Freq: Every day | ORAL | 0 refills | Status: DC | PRN

## 2023-04-01 ENCOUNTER — Other Ambulatory Visit: Payer: Self-pay | Admitting: Nurse Practitioner

## 2023-04-01 DIAGNOSIS — E039 Hypothyroidism, unspecified: Secondary | ICD-10-CM

## 2023-04-01 MED ORDER — SYNTHROID 25 MCG PO TABS
12.5000 ug | ORAL_TABLET | Freq: Every day | ORAL | 0 refills | Status: DC
Start: 2023-04-01 — End: 2023-07-02

## 2023-04-06 ENCOUNTER — Other Ambulatory Visit: Payer: Self-pay | Admitting: Nurse Practitioner

## 2023-04-06 DIAGNOSIS — F411 Generalized anxiety disorder: Secondary | ICD-10-CM

## 2023-04-07 MED ORDER — ALPRAZOLAM 0.5 MG PO TABS
0.5000 mg | ORAL_TABLET | Freq: Every day | ORAL | 0 refills | Status: DC | PRN
Start: 2023-04-07 — End: 2023-05-21

## 2023-04-15 ENCOUNTER — Other Ambulatory Visit: Payer: Self-pay | Admitting: Nurse Practitioner

## 2023-04-15 DIAGNOSIS — F411 Generalized anxiety disorder: Secondary | ICD-10-CM

## 2023-04-15 NOTE — Telephone Encounter (Signed)
BusPIRone 5MG     LAST APPOINTMENT DATE: 11/07/2022   NEXT APPOINTMENT DATE: Visit date not found   BUSPAR LAST REFILL: 10/18/2022   QTY: #270 Tablet 1RF

## 2023-04-21 ENCOUNTER — Other Ambulatory Visit: Payer: Self-pay | Admitting: Nurse Practitioner

## 2023-04-21 DIAGNOSIS — F411 Generalized anxiety disorder: Secondary | ICD-10-CM

## 2023-05-18 ENCOUNTER — Other Ambulatory Visit: Payer: Self-pay | Admitting: Nurse Practitioner

## 2023-05-18 DIAGNOSIS — F411 Generalized anxiety disorder: Secondary | ICD-10-CM

## 2023-05-19 NOTE — Telephone Encounter (Signed)
Refill ordered on 05/19/23

## 2023-05-21 ENCOUNTER — Other Ambulatory Visit: Payer: Self-pay | Admitting: Nurse Practitioner

## 2023-05-21 DIAGNOSIS — F411 Generalized anxiety disorder: Secondary | ICD-10-CM

## 2023-05-21 MED ORDER — ALPRAZOLAM 0.5 MG PO TABS
0.5000 mg | ORAL_TABLET | Freq: Every day | ORAL | 0 refills | Status: DC | PRN
Start: 2023-05-21 — End: 2023-06-10

## 2023-05-21 NOTE — Telephone Encounter (Signed)
Need to schedule appointment?   LAST APPOINTMENT DATE: 11/07/22   NEXT APPOINTMENT DATE: Visit date not found   XANAX  LAST REFILL: 04/07/23  QTY: #30 0RF

## 2023-06-03 ENCOUNTER — Ambulatory Visit: Payer: 59 | Admitting: Podiatry

## 2023-06-03 ENCOUNTER — Encounter: Payer: Self-pay | Admitting: Podiatry

## 2023-06-03 DIAGNOSIS — L603 Nail dystrophy: Secondary | ICD-10-CM

## 2023-06-03 NOTE — Progress Notes (Signed)
Subjective:  Patient ID: Victoria Russell, female    DOB: [birth year],  MRN: [id number]  Chief Complaint  Patient presents with   Nail Problem    37 y.o. female presents with the above complaint.  Patient presents with right hallux nail dystrophy after she had a history of dropping something on top of her nail.  She states that the nail did not grow back correct.  She wanted to get it evaluated does not cause her any pain.  She wanted to discuss treatment options for it.  Pain scale 0 out of 10.   Review of Systems: Negative except as noted in the HPI. Denies N/V/F/Ch.  Past Medical History:  Diagnosis Date   ADHD (attention deficit hyperactivity disorder)    Anxiety    no meds   Atypical moles    dysplasia   Diverticulitis    Dysmenorrhea    History of kidney stones    2012, passed stones, no surgery required   HLD (hyperlipidemia)    diet controlled   Hx of varicella    Hyperhidrosis    Hypothyroidism    Palpitations 06/14/2011   Pregnancy induced hypertension    resolved with delivery of 01/2013 pregnancy   RENAL CALCULUS, HX OF 12/16/2008   Qualifier: Diagnosis of  By: Milinda Antis MD, Colon Flattery    Right ureteral stone 12/2007   Wolff-Parkinson-White (WPW) syndrome    history, no problems since age 78 yrs old    Current Outpatient Medications:    cefdinir (OMNICEF) 300 MG capsule, Take 300 mg by mouth every 12 (twelve) hours., Disp: , Rfl:    diazepam (VALIUM) 10 MG tablet, SMARTSIG:1 Tablet(s) By Mouth, Disp: , Rfl:    nitrofurantoin, macrocrystal-monohydrate, (MACROBID) 100 MG capsule, Take 100 mg by mouth every 12 (twelve) hours., Disp: , Rfl:    promethazine (PHENERGAN) 25 MG tablet, Take 12.5-25 mg by mouth every 4 (four) hours as needed., Disp: , Rfl:    ALPRAZolam (XANAX) 0.5 MG tablet, Take 1 tablet (0.5 mg total) by mouth daily as needed for anxiety., Disp: 30 tablet, Rfl: 0   busPIRone (BUSPAR) 5 MG tablet, TAKE 1 TABLET IN THE MORNING AND TAKE 2 TABLETS IN THE  EVENING, Disp: 270 tablet, Rfl: 1   SYNTHROID 25 MCG tablet, Take 0.5 tablets (12.5 mcg total) by mouth daily., Disp: 45 tablet, Rfl: 0   venlafaxine XR (EFFEXOR-XR) 150 MG 24 hr capsule, TAKE 1 CAPSULE (150 MG TOTAL) BY MOUTH DAILY WITH BREAKFAST. WILL NEED TO BE SEEN IN OFFICE FOR MORE REFILLS., Disp: 90 capsule, Rfl: 1  Social History   Tobacco Use  Smoking Status Never  Smokeless Tobacco Never    No Known Allergies Objective:  There were no vitals filed for this visit. There is no height or weight on file to calculate BMI. Constitutional Well developed. Well nourished.  Vascular Dorsalis pedis pulses palpable bilaterally. Posterior tibial pulses palpable bilaterally. Capillary refill normal to all digits.  No cyanosis or clubbing noted. Pedal hair growth normal.  Neurologic Normal speech. Oriented to person, place, and time. Epicritic sensation to light touch grossly present bilaterally.  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  50% growth  Orthopedic: Normal joint ROM without pain or crepitus bilaterally. No visible deformities. No bony tenderness.   Radiographs: None Assessment:   1. Nail dystrophy    Plan:  Patient was evaluated and treated and all questions answered.  Right hallux nail dystrophy -All questions and concerns were discussed  with the patient in extensive detail given the amount of damage done to the nail I discussed with the patient she would ultimately benefit from removal of the nail with phenol matricectomy I discussed this with the patient she states understand would like to think about the procedure and get back to me if she would like to do it in the future.  No follow-ups on file.

## 2023-06-10 ENCOUNTER — Other Ambulatory Visit: Payer: Self-pay | Admitting: Nurse Practitioner

## 2023-06-10 DIAGNOSIS — F411 Generalized anxiety disorder: Secondary | ICD-10-CM

## 2023-06-10 MED ORDER — ALPRAZOLAM 0.5 MG PO TABS
0.5000 mg | ORAL_TABLET | Freq: Every day | ORAL | 0 refills | Status: DC | PRN
Start: 2023-06-10 — End: 2023-07-20

## 2023-06-10 NOTE — Telephone Encounter (Signed)
Can we schedule for CPE. This is the last refill until she is seen

## 2023-06-11 NOTE — Telephone Encounter (Signed)
Pt needs to be scheduled for CPE please.

## 2023-06-12 NOTE — Telephone Encounter (Signed)
Can we see if there is a slot open in the next 30 days that we can get her in for her CPE please

## 2023-06-12 NOTE — Telephone Encounter (Signed)
Lvmtcb, sent mychart message  

## 2023-06-16 NOTE — Telephone Encounter (Signed)
Patient has been scheduled for cpe in November

## 2023-06-20 ENCOUNTER — Telehealth: Payer: Self-pay | Admitting: Nurse Practitioner

## 2023-06-20 NOTE — Telephone Encounter (Signed)
Ok by me also

## 2023-06-20 NOTE — Telephone Encounter (Signed)
Pt would like a TOC from NP Cable to NP Konrad Dolores because she lives closer to the [place] office

## 2023-07-02 ENCOUNTER — Other Ambulatory Visit: Payer: Self-pay | Admitting: Nurse Practitioner

## 2023-07-02 DIAGNOSIS — E039 Hypothyroidism, unspecified: Secondary | ICD-10-CM

## 2023-07-08 ENCOUNTER — Ambulatory Visit: Payer: 59 | Admitting: Family Medicine

## 2023-07-20 ENCOUNTER — Other Ambulatory Visit: Payer: Self-pay | Admitting: Nurse Practitioner

## 2023-07-20 DIAGNOSIS — F411 Generalized anxiety disorder: Secondary | ICD-10-CM

## 2023-07-21 MED ORDER — ALPRAZOLAM 0.5 MG PO TABS: 0.5000 mg | ORAL_TABLET | Freq: Every day | ORAL | 0 refills | Status: DC | PRN

## 2023-08-01 ENCOUNTER — Encounter: Payer: Self-pay | Admitting: Nurse Practitioner

## 2023-08-01 ENCOUNTER — Ambulatory Visit: Payer: 59 | Admitting: Nurse Practitioner

## 2023-08-01 VITALS — BP 126/88 | HR 75 | Temp 98.3°F | Ht 64.0 in | Wt 166.8 lb

## 2023-08-01 DIAGNOSIS — F5104 Psychophysiologic insomnia: Secondary | ICD-10-CM

## 2023-08-01 DIAGNOSIS — F411 Generalized anxiety disorder: Secondary | ICD-10-CM | POA: Diagnosis not present

## 2023-08-01 DIAGNOSIS — E039 Hypothyroidism, unspecified: Secondary | ICD-10-CM

## 2023-08-01 DIAGNOSIS — E785 Hyperlipidemia, unspecified: Secondary | ICD-10-CM | POA: Diagnosis not present

## 2023-08-01 DIAGNOSIS — M549 Dorsalgia, unspecified: Secondary | ICD-10-CM | POA: Diagnosis not present

## 2023-08-01 LAB — POC URINALSYSI DIPSTICK (AUTOMATED)
Bilirubin, UA: NEGATIVE
Blood, UA: NEGATIVE
Glucose, UA: NEGATIVE
Ketones, UA: NEGATIVE
Nitrite, UA: NEGATIVE
Protein, UA: NEGATIVE
Spec Grav, UA: 1.01 (ref 1.010–1.025)
Urobilinogen, UA: 0.2 U/dL
pH, UA: 6 (ref 5.0–8.0)

## 2023-08-01 MED ORDER — VENLAFAXINE HCL ER 75 MG PO CP24
75.0000 mg | ORAL_CAPSULE | Freq: Every day | ORAL | 0 refills | Status: DC
Start: 2023-08-01 — End: 2023-08-26

## 2023-08-01 NOTE — Progress Notes (Signed)
[name], NP-C Phone: [phone]  Victoria Russell is a 37 y.o. female who presents today for transfer of care.   Anxiety/Insomnia- Patient currently on Effexor XR 150 mg daily, Buspar 5 mg TID and Xanax 0.5 mg at bedtime. She notes trouble falling and staying asleep. She is up multiple times at night. She is interested in coming off her Effexor. She would like to decrease her dose. PHQ- 2 and GAD- 3 today.   HYPERLIPIDEMIA Symptoms Chest pain on exertion:  No   Leg claudication:   No Medications: Compliance- Diet controlled Right upper quadrant pain- No  Muscle aches- No Lipid Panel     Component Value Date/Time   CHOL 216 (H) 04/26/2022 1003   TRIG 85.0 04/26/2022 1003   HDL 76.40 04/26/2022 1003   CHOLHDL 3 04/26/2022 1003   VLDL 17.0 04/26/2022 1003   LDLCALC 123 (H) 04/26/2022 1003   HYPOTHYROIDISM Disease Monitoring Weight changes: No  Skin Changes: No Palpitations: No Heat/Cold intolerance: No Medication Monitoring Compliance:  Levothyroxine   Last TSH:   Lab Results  Component Value Date   TSH 1.81 04/26/2022   Back pain- Patient with worsening back and flank pain for several weeks. She has a Hx of endometriosis and believes that is what is causing her pain, however she would like to have her urine checked for UTI. UTI:  Dysuria- Yes  Frequency- No   Urgency- No   Hematuria- No   Fever- No  Abd pain- Suprapubic   Vaginal d/c- No   Social History   Tobacco Use  Smoking Status Never  Smokeless Tobacco Never    Current Outpatient Medications on File Prior to Visit  Medication Sig Dispense Refill   ALPRAZolam (XANAX) 0.5 MG tablet Take 1 tablet (0.5 mg total) by mouth daily as needed for anxiety. 30 tablet 0   busPIRone (BUSPAR) 5 MG tablet TAKE 1 TABLET IN THE MORNING AND TAKE 2 TABLETS IN THE EVENING 270 tablet 1   SYNTHROID 25 MCG tablet TAKE 0.5 TABLETS BY MOUTH DAILY. 45 tablet 0   No current facility-administered medications on file prior to  visit.    ROS see history of present illness  Objective  Physical Exam Vitals:   08/01/23 1542  BP: 126/88  Pulse: 75  Temp: 98.3 F (36.8 C)  SpO2: 97%    BP Readings from Last 3 Encounters:  08/01/23 126/88  09/04/22 130/84  04/26/22 100/70   Wt Readings from Last 3 Encounters:  08/01/23 166 lb 12.8 oz (75.7 kg)  11/07/22 150 lb (68 kg)  09/04/22 158 lb (71.7 kg)    Physical Exam Constitutional:      General: She is not in acute distress.    Appearance: Normal appearance.  HENT:     Head: Normocephalic.  Cardiovascular:     Rate and Rhythm: Normal rate and regular rhythm.     Heart sounds: Normal heart sounds.  Pulmonary:     Effort: Pulmonary effort is normal.     Breath sounds: Normal breath sounds.  Abdominal:     General: Abdomen is flat. Bowel sounds are normal.     Palpations: Abdomen is soft.     Tenderness: There is abdominal tenderness (suprapubic).  Skin:    General: Skin is warm and dry.  Neurological:     General: No focal deficit present.     Mental Status: She is alert.  Psychiatric:        Mood and Affect: Mood normal.  Behavior: Behavior normal.    Assessment/Plan: Please see individual problem list.  Generalized anxiety disorder Assessment & Plan: Chronic issue. Patient wanting to come off her Effexor XR. Will decrease to 75 mg daily. Counseled on common side effects of decreasing dose. She will continue her Buspar 5 mg TID and Xanax 0.5 mg at bedtime. GAD- 3 today. She will return in 4 weeks, sooner PRN.   Orders: -     Venlafaxine HCl ER; Take 1 capsule (75 mg total) by mouth daily with breakfast.  Dispense: 30 capsule; Refill: 0  Psychophysiological insomnia Assessment & Plan: Chronic issue. She has tried Trazodone and Ambien in the past. Currently on Xanax 0.5 mg at bedtime. She continues to have occasional difficulty falling and staying asleep. She would like to continue Xanax for now, will continue to monitor.    Back  pain, unspecified back location, unspecified back pain laterality, unspecified chronicity Assessment & Plan: UA in office with trace leukocytes only. Microscopic and culture pending. Will contact patient with results. She is scheduled to follow up with her Ob-Gyn next week regarding her endometriosis, she is interesting in a hysterectomy. Encouraged adequate fluid intake. Further work up pending results. Return precautions given to patient.   Orders: -     Urinalysis, Routine w reflex microscopic -     POCT Urinalysis Dipstick (Automated) -     Urine Culture  Hyperlipidemia, unspecified hyperlipidemia type Assessment & Plan: Chronic issue. LDL above goal. Not on any medications. Encouraged healthy diet and exercise. Will check fasting lipids at next appointment.    Hypothyroidism, unspecified type Assessment & Plan: Chronic. Stable on Levothyroxine 25 mcg, half tablet daily. Continue. Will check TSH at next appointment.     Return in about 4 weeks (around 08/29/2023) for Anxiety/Depression.   [name], NP-C [facility] - [employer]

## 2023-08-02 LAB — URINE CULTURE
MICRO NUMBER:: 15644519
Result:: NO GROWTH
SPECIMEN QUALITY:: ADEQUATE

## 2023-08-02 LAB — URINALYSIS, ROUTINE W REFLEX MICROSCOPIC
Bilirubin Urine: NEGATIVE
Glucose, UA: NEGATIVE
Hgb urine dipstick: NEGATIVE
Ketones, ur: NEGATIVE
Leukocytes,Ua: NEGATIVE
Nitrite: NEGATIVE
Protein, ur: NEGATIVE
Specific Gravity, Urine: 1.007 (ref 1.001–1.035)
pH: 6 (ref 5.0–8.0)

## 2023-08-05 ENCOUNTER — Encounter: Payer: Self-pay | Admitting: Nurse Practitioner

## 2023-08-05 ENCOUNTER — Telehealth: Payer: Self-pay

## 2023-08-05 NOTE — Telephone Encounter (Signed)
LVM to CB in regards to UA results

## 2023-08-11 ENCOUNTER — Encounter: Payer: Self-pay | Admitting: Nurse Practitioner

## 2023-08-11 DIAGNOSIS — M549 Dorsalgia, unspecified: Secondary | ICD-10-CM | POA: Insufficient documentation

## 2023-08-11 NOTE — Assessment & Plan Note (Signed)
UA in office with trace leukocytes only. Microscopic and culture pending. Will contact patient with results. She is scheduled to follow up with her Ob-Gyn next week regarding her endometriosis, she is interesting in a hysterectomy. Encouraged adequate fluid intake. Further work up pending results. Return precautions given to patient.

## 2023-08-11 NOTE — Assessment & Plan Note (Signed)
Chronic. Stable on Levothyroxine 25 mcg, half tablet daily. Continue. Will check TSH at next appointment.

## 2023-08-11 NOTE — Assessment & Plan Note (Signed)
Chronic issue. Patient wanting to come off her Effexor XR. Will decrease to 75 mg daily. Counseled on common side effects of decreasing dose. She will continue her Buspar 5 mg TID and Xanax 0.5 mg at bedtime. GAD- 3 today. She will return in 4 weeks, sooner PRN.

## 2023-08-11 NOTE — Assessment & Plan Note (Signed)
Chronic issue. She has tried Trazodone and Ambien in the past. Currently on Xanax 0.5 mg at bedtime. She continues to have occasional difficulty falling and staying asleep. She would like to continue Xanax for now, will continue to monitor.

## 2023-08-11 NOTE — Addendum Note (Signed)
Addended by: [name] on: 08/11/2023 11:26 AM   Modules accepted: Level of Service

## 2023-08-11 NOTE — Assessment & Plan Note (Addendum)
Chronic issue. LDL above goal. Not on any medications. Encouraged healthy diet and exercise. Will check fasting lipids at next appointment.

## 2023-08-21 ENCOUNTER — Other Ambulatory Visit: Payer: Self-pay | Admitting: Nurse Practitioner

## 2023-08-21 DIAGNOSIS — F411 Generalized anxiety disorder: Secondary | ICD-10-CM

## 2023-08-21 MED ORDER — ALPRAZOLAM 0.5 MG PO TABS
0.5000 mg | ORAL_TABLET | Freq: Every day | ORAL | 5 refills | Status: DC | PRN
Start: 2023-08-21 — End: 2024-02-16

## 2023-08-24 ENCOUNTER — Other Ambulatory Visit: Payer: Self-pay | Admitting: Nurse Practitioner

## 2023-08-24 DIAGNOSIS — F411 Generalized anxiety disorder: Secondary | ICD-10-CM

## 2023-08-28 ENCOUNTER — Ambulatory Visit: Payer: 59 | Admitting: Nurse Practitioner

## 2023-09-03 ENCOUNTER — Encounter: Payer: 59 | Admitting: Nurse Practitioner

## 2023-09-12 ENCOUNTER — Ambulatory Visit: Payer: 59 | Admitting: Nurse Practitioner

## 2023-09-12 ENCOUNTER — Encounter: Payer: Self-pay | Admitting: Nurse Practitioner

## 2023-09-12 VITALS — BP 118/82 | HR 76 | Temp 98.1°F | Ht 64.0 in | Wt 152.4 lb

## 2023-09-12 DIAGNOSIS — F411 Generalized anxiety disorder: Secondary | ICD-10-CM

## 2023-09-12 DIAGNOSIS — F5104 Psychophysiologic insomnia: Secondary | ICD-10-CM | POA: Diagnosis not present

## 2023-09-12 DIAGNOSIS — E785 Hyperlipidemia, unspecified: Secondary | ICD-10-CM | POA: Diagnosis not present

## 2023-09-12 DIAGNOSIS — E039 Hypothyroidism, unspecified: Secondary | ICD-10-CM

## 2023-09-12 MED ORDER — VENLAFAXINE HCL ER 37.5 MG PO CP24: 37.5000 mg | ORAL_CAPSULE | Freq: Every day | ORAL | 0 refills | Status: DC

## 2023-09-12 MED ORDER — BUSPIRONE HCL 7.5 MG PO TABS: 7.5000 mg | ORAL_TABLET | Freq: Three times a day (TID) | ORAL | 1 refills | Status: DC

## 2023-09-12 NOTE — Assessment & Plan Note (Addendum)
LDL above goal. Not on any medications. Encouraged healthy diet and exercise. We will check lipid panel today.

## 2023-09-12 NOTE — Progress Notes (Signed)
[name], NP-C Phone: [phone]  Victoria Russell is a 37 y.o. female who presents today for follow up.   Discussed the use of AI scribe software for clinical note transcription with the patient, who gave verbal consent to proceed.  History of Present Illness   The patient, with a history of anxiety, has been on a regimen of Effexor, Buspar, and Xanax. Recently, the patient has been weaning off Effexor, reducing the dose from 150mg  to 75mg  without experiencing any withdrawal symptoms. They express a desire to discontinue Effexor completely and are open to further reducing the dose to 37.5mg .  The patient has also been taking Buspar, one dose in the morning and two at night. They noticed a significant difference in their sleep when they reduced the night dose by one. They believe that increasing the Buspar dosage might eliminate the need for Xanax, which they currently take only at bedtime.  In addition to their prescribed medications, the patient has been self-administering Tirzepatide injections that they are buying, which they report have been effective in promoting weight loss. They have not experienced any severe side effects from these injections, but they do report some nausea.  The patient also mentions that they are scheduled for a partial hysterectomy in the near future. They express a preference for a partial rather than a full hysterectomy to avoid the onset of menopause.      Social History   Tobacco Use  Smoking Status Never  Smokeless Tobacco Never    Current Outpatient Medications on File Prior to Visit  Medication Sig Dispense Refill   ALPRAZolam (XANAX) 0.5 MG tablet Take 1 tablet (0.5 mg total) by mouth daily as needed for anxiety. 30 tablet 5   SYNTHROID 25 MCG tablet TAKE 0.5 TABLETS BY MOUTH DAILY. 45 tablet 0   No current facility-administered medications on file prior to visit.     ROS see history of present illness  Objective  Physical Exam Vitals:    09/12/23 1458  BP: 118/82  Pulse: 76  Temp: 98.1 F (36.7 C)  SpO2: 97%    BP Readings from Last 3 Encounters:  09/12/23 118/82  08/01/23 126/88  09/04/22 130/84   Wt Readings from Last 3 Encounters:  09/12/23 152 lb 6.4 oz (69.1 kg)  08/01/23 166 lb 12.8 oz (75.7 kg)  11/07/22 150 lb (68 kg)    Physical Exam Constitutional:      General: She is not in acute distress.    Appearance: Normal appearance.  HENT:     Head: Normocephalic.  Cardiovascular:     Rate and Rhythm: Normal rate and regular rhythm.     Heart sounds: Normal heart sounds.  Pulmonary:     Effort: Pulmonary effort is normal.     Breath sounds: Normal breath sounds.  Skin:    General: Skin is warm and dry.  Neurological:     General: No focal deficit present.     Mental Status: She is alert.  Psychiatric:        Mood and Affect: Mood normal.        Behavior: Behavior normal.    Assessment/Plan: Please see individual problem list.  Generalized anxiety disorder Assessment & Plan: They are stable on their current regimen of Effexor and Buspar, reporting improved sleep with Buspar and expressing interest in discontinuing Xanax. We will increase Buspar to 7.5mg  in the morning and 15mg  at night. They wish to taper off Effexor, currently on 75mg  daily without withdrawal symptoms. We  will decrease Effexor to 37.5mg  daily for two weeks, then to 37.5mg  every other day for another two weeks, with a plan to discontinue Effexor after this tapering period. PHQ- 3 and GAD- 4 today. Counseled on symptoms of withdrawal. Encouraged to contact if worsening symptoms, unusual behavior changes or suicidal thoughts occur. She will follow up in 6 weeks, sooner if needed.   Orders: -     CBC with Differential/Platelet -     Comprehensive metabolic panel -     Venlafaxine HCl ER; Take 1 capsule (37.5 mg total) by mouth daily with breakfast.  Dispense: 30 capsule; Refill: 0  Psychophysiological insomnia Assessment &  Plan: They are stable on their current regimen of Xanax and Buspar, reporting improved sleep with Buspar and expressing interest in discontinuing Xanax. We will increase Buspar to 7.5mg  in the morning and 15mg  at night. We plan to reevaluate their need for Xanax at the next visit.  Orders: -     busPIRone HCl; Take 1 tablet (7.5 mg total) by mouth 3 (three) times daily.  Dispense: 270 tablet; Refill: 1  Hyperlipidemia, unspecified hyperlipidemia type Assessment & Plan: LDL above goal. Not on any medications. Encouraged healthy diet and exercise. We will check lipid panel today.  Orders: -     Lipid panel  Hypothyroidism, unspecified type Assessment & Plan: Her hypothyroidism is well controlled on Levothyroxine 25 mcg, half tablet daily. Continue. We will check TSH today.  Orders: -     TSH   Return in about 6 weeks (around 10/24/2023) for Anxiety/Depression.   [name], NP-C [facility] - [employer]

## 2023-09-12 NOTE — Assessment & Plan Note (Addendum)
They are stable on their current regimen of Xanax and Buspar, reporting improved sleep with Buspar and expressing interest in discontinuing Xanax. We will increase Buspar to 7.5mg  in the morning and 15mg  at night. We plan to reevaluate their need for Xanax at the next visit.

## 2023-09-12 NOTE — Assessment & Plan Note (Addendum)
Her hypothyroidism is well controlled on Levothyroxine 25 mcg, half tablet daily. Continue. We will check TSH today.

## 2023-09-12 NOTE — Assessment & Plan Note (Addendum)
They are stable on their current regimen of Effexor and Buspar, reporting improved sleep with Buspar and expressing interest in discontinuing Xanax. We will increase Buspar to 7.5mg  in the morning and 15mg  at night. They wish to taper off Effexor, currently on 75mg  daily without withdrawal symptoms. We will decrease Effexor to 37.5mg  daily for two weeks, then to 37.5mg  every other day for another two weeks, with a plan to discontinue Effexor after this tapering period. PHQ- 3 and GAD- 4 today. Counseled on symptoms of withdrawal. Encouraged to contact if worsening symptoms, unusual behavior changes or suicidal thoughts occur. She will follow up in 6 weeks, sooner if needed.

## 2023-09-13 LAB — CBC WITH DIFFERENTIAL/PLATELET
Basophils Absolute: 0.1 10*3/uL (ref 0.0–0.2)
Basos: 1 %
EOS (ABSOLUTE): 0.1 10*3/uL (ref 0.0–0.4)
Eos: 1 %
Hematocrit: 46.7 % — ABNORMAL HIGH (ref 34.0–46.6)
Hemoglobin: 15.1 g/dL (ref 11.1–15.9)
Immature Grans (Abs): 0 10*3/uL (ref 0.0–0.1)
Immature Granulocytes: 0 %
Lymphocytes Absolute: 2.2 10*3/uL (ref 0.7–3.1)
Lymphs: 30 %
MCH: 29.5 pg (ref 26.6–33.0)
MCHC: 32.3 g/dL (ref 31.5–35.7)
MCV: 91 fL (ref 79–97)
Monocytes Absolute: 0.7 10*3/uL (ref 0.1–0.9)
Monocytes: 9 %
Neutrophils Absolute: 4.4 10*3/uL (ref 1.4–7.0)
Neutrophils: 59 %
Platelets: 262 10*3/uL (ref 150–450)
RBC: 5.12 x10E6/uL (ref 3.77–5.28)
RDW: 12.3 % (ref 11.7–15.4)
WBC: 7.4 10*3/uL (ref 3.4–10.8)

## 2023-09-13 LAB — TSH: TSH: 1.22 u[IU]/mL (ref 0.450–4.500)

## 2023-09-13 LAB — COMPREHENSIVE METABOLIC PANEL
ALT: 16 [IU]/L (ref 0–32)
AST: 20 [IU]/L (ref 0–40)
Albumin: 4.3 g/dL (ref 3.9–4.9)
Alkaline Phosphatase: 82 [IU]/L (ref 44–121)
BUN/Creatinine Ratio: 8 — ABNORMAL LOW (ref 9–23)
BUN: 8 mg/dL (ref 6–20)
Bilirubin Total: 0.4 mg/dL (ref 0.0–1.2)
CO2: 19 mmol/L — ABNORMAL LOW (ref 20–29)
Calcium: 9.4 mg/dL (ref 8.7–10.2)
Chloride: 101 mmol/L (ref 96–106)
Creatinine, Ser: 1 mg/dL (ref 0.57–1.00)
Globulin, Total: 3.3 g/dL (ref 1.5–4.5)
Glucose: 79 mg/dL (ref 70–99)
Potassium: 4.1 mmol/L (ref 3.5–5.2)
Sodium: 138 mmol/L (ref 134–144)
Total Protein: 7.6 g/dL (ref 6.0–8.5)
eGFR: 75 mL/min/{1.73_m2} (ref >59)

## 2023-09-13 LAB — LIPID PANEL
Chol/HDL Ratio: 3 {ratio} (ref 0.0–4.4)
Cholesterol, Total: 185 mg/dL (ref 100–199)
HDL: 61 mg/dL (ref >39)
LDL Chol Calc (NIH): 108 mg/dL — ABNORMAL HIGH (ref 0–99)
Triglycerides: 85 mg/dL (ref 0–149)
VLDL Cholesterol Cal: 16 mg/dL (ref 5–40)

## 2023-09-17 ENCOUNTER — Encounter: Payer: Self-pay | Admitting: Nurse Practitioner

## 2023-09-28 ENCOUNTER — Telehealth: Payer: 59 | Admitting: Family Medicine

## 2023-09-28 DIAGNOSIS — N3000 Acute cystitis without hematuria: Secondary | ICD-10-CM | POA: Diagnosis not present

## 2023-09-28 MED ORDER — CEPHALEXIN 500 MG PO CAPS: 500.0000 mg | ORAL_CAPSULE | Freq: Two times a day (BID) | ORAL | 0 refills | Status: AC

## 2023-09-28 NOTE — Progress Notes (Signed)
E-Visit for Urinary Problems  We are sorry that you are not feeling well.  Here is how we plan to help!  Based on what you shared with me it looks like you most likely have a simple urinary tract infection.  A UTI (Urinary Tract Infection) is a bacterial infection of the bladder.  Most cases of urinary tract infections are simple to treat but a key part of your care is to encourage you to drink plenty of fluids and watch your symptoms carefully.  I have prescribed Keflex 500 mg twice a day for 7 days.  Your symptoms should gradually improve. Call us if the burning in your urine worsens, you develop worsening fever, back pain or pelvic pain or if your symptoms do not resolve after completing the antibiotic.  Urinary tract infections can be prevented by drinking plenty of water to keep your body hydrated.  Also be sure when you wipe, wipe from front to back and don't hold it in!  If possible, empty your bladder every 4 hours.  HOME CARE Drink plenty of fluids Compete the full course of the antibiotics even if the symptoms resolve Remember, when you need to go.go. Holding in your urine can increase the likelihood of getting a UTI! GET HELP RIGHT AWAY IF: You cannot urinate You get a high fever Worsening back pain occurs You see blood in your urine You feel sick to your stomach or throw up You feel like you are going to pass out  MAKE SURE YOU  Understand these instructions. Will watch your condition. Will get help right away if you are not doing well or get worse.   Thank you for choosing an e-visit.  Your e-visit answers were reviewed by a board certified advanced clinical practitioner to complete your personal care plan. Depending upon the condition, your plan could have included both over the counter or prescription medications.  Please review your pharmacy choice. Make sure the pharmacy is open so you can pick up prescription now. If there is a problem, you may contact your  provider through MyChart messaging and have the prescription routed to another pharmacy.  Your safety is important to us. If you have drug allergies check your prescription carefully.   For the next 24 hours you can use MyChart to ask questions about today's visit, request a non-urgent call back, or ask for a work or school excuse. You will get an email in the next two days asking about your experience. I hope that your e-visit has been valuable and will speed your recovery.    have provided 5 minutes of non face to face time during this encounter for chart review and documentation.   

## 2023-09-30 ENCOUNTER — Other Ambulatory Visit: Payer: Self-pay | Admitting: Nurse Practitioner

## 2023-09-30 DIAGNOSIS — E039 Hypothyroidism, unspecified: Secondary | ICD-10-CM

## 2023-10-05 ENCOUNTER — Other Ambulatory Visit: Payer: Self-pay | Admitting: Nurse Practitioner

## 2023-10-05 DIAGNOSIS — F411 Generalized anxiety disorder: Secondary | ICD-10-CM

## 2023-10-31 ENCOUNTER — Ambulatory Visit: Payer: 59 | Admitting: Nurse Practitioner

## 2023-10-31 VITALS — BP 110/80 | HR 71 | Temp 98.1°F | Ht 64.0 in | Wt 142.0 lb

## 2023-10-31 DIAGNOSIS — E039 Hypothyroidism, unspecified: Secondary | ICD-10-CM

## 2023-10-31 DIAGNOSIS — F411 Generalized anxiety disorder: Secondary | ICD-10-CM

## 2023-10-31 DIAGNOSIS — F5104 Psychophysiologic insomnia: Secondary | ICD-10-CM

## 2023-10-31 DIAGNOSIS — N809 Endometriosis, unspecified: Secondary | ICD-10-CM | POA: Diagnosis not present

## 2023-10-31 NOTE — Progress Notes (Signed)
Victoria Dicker, NP-C Phone: [phone]  Victoria Russell is a 38 y.o. female who presents today for follow up.   Discussed the use of AI scribe software for clinical note transcription with the patient, who gave verbal consent to proceed.  History of Present Illness   The patient, with a history of anxiety and depression, has been on a regimen of Effexor, Buspar, and Xanax. They had been attempting to taper off Effexor, reducing the dose from 150mg  to 75mg , and then to 37.5mg . However, upon reaching the 37.5mg  dose, they experienced withdrawal symptoms and did not feel well, leading them to return to the 75mg  dose. They are considering a slower tapering process, taking the 75mg  dose every other day for a couple of weeks before attempting to reduce the dose again.  The patient also takes Buspar, but they do not feel it is necessary and are considering discontinuing it. 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 They report good sleep with the use of Xanax, typically achieving about five hours per night.  In addition to their mental health concerns, the patient is scheduled for a partial hysterectomy due to endometriosis. They have a history of two C-sections and are hoping for a laparoscopic procedure, but are aware that a more invasive procedure may be necessary depending on the extent of the endometriosis. They are also aware that if their ovaries are removed during the procedure, they will enter menopause and may need hormone replacement therapy.      Social History   Tobacco Use  Smoking Status Never  Smokeless Tobacco Never    Current Outpatient Medications on File Prior to Visit  Medication Sig Dispense Refill   ALPRAZolam (XANAX) 0.5 MG tablet Take 1 tablet (0.5 mg total) by mouth daily as needed for anxiety. 30 tablet 5   SYNTHROID 25 MCG tablet TAKE 0.5 TABLETS BY MOUTH DAILY. 45 tablet 3    venlafaxine XR (EFFEXOR-XR) 37.5 MG 24 hr capsule TAKE 1 CAPSULE BY MOUTH DAILY WITH BREAKFAST. 90 capsule 1   No current facility-administered medications on file prior to visit.    ROS see history of present illness  Objective  Physical Exam Vitals:   10/31/23 1459  BP: 110/80  Pulse: 71  Temp: 98.1 F (36.7 C)  SpO2: 99%    BP Readings from Last 3 Encounters:  10/31/23 110/80  09/12/23 118/82  08/01/23 126/88   Wt Readings from Last 3 Encounters:  10/31/23 142 lb (64.4 kg)  09/12/23 152 lb 6.4 oz (69.1 kg)  08/01/23 166 lb 12.8 oz (75.7 kg)    Physical Exam Constitutional:      General: She is not in acute distress.    Appearance: Normal appearance.  HENT:     Head: Normocephalic.  Cardiovascular:     Rate and Rhythm: Normal rate and regular rhythm.     Heart sounds: Normal heart sounds.  Pulmonary:     Effort: Pulmonary effort is normal.     Breath sounds: Normal breath sounds.  Skin:    General: Skin is warm and dry.  Neurological:     General: No focal deficit present.     Mental Status: She is alert.  Psychiatric:        Mood and Affect: Mood normal.        Behavior: Behavior normal.    Assessment/Plan: Please see individual problem list.  Generalized anxiety  disorder Assessment & Plan: Withdrawal symptoms occurred when reducing Effexor from 75mg  to 37.5mg . Currently stable on 75mg  daily. A slower tapering plan was discussed. Continue Effexor 75mg  daily, taper to 75mg  every other day for two weeks, then decrease to 37.5mg  daily. Feels that there is no perceived benefit from Buspar for anxiety. Currently taking one in the morning and two at bedtime. Discontinue Buspar as preferred. PHQ- 2 and GAD- 2 today. Counseled on symptoms of withdrawal. Encouraged to contact if worsening symptoms, unusual behavior changes or suicidal thoughts occur.    Psychophysiological insomnia Assessment & Plan: Reports good sleep with Xanax 0.5mg  at bedtime. No plans to  discontinue due to sleep quality and preference. Continue Xanax at bedtime. Refills not needed at this time.    Endometriosis Assessment & Plan: Scheduled for laparoscopic partial hysterectomy with salpingectomy on February 11th.   Hypothyroidism, unspecified type Assessment & Plan: Her hypothyroidism is well controlled on Levothyroxine 25 mcg, half tablet daily. Continue.     Return in about 6 months (around 04/29/2024) for Follow up.   Victoria Dicker, NP-C [facility]

## 2023-10-31 NOTE — Assessment & Plan Note (Signed)
Withdrawal symptoms occurred when reducing Effexor from 75mg  to 37.5mg . Currently stable on 75mg  daily. A slower tapering plan was discussed. Continue Effexor 75mg  daily, taper to 75mg  every other day for two weeks, then decrease to 37.5mg  daily. Feels that there is no perceived benefit from Buspar for anxiety. Currently taking one in the morning and two at bedtime. Discontinue Buspar as preferred. PHQ- 2 and GAD- 2 today. Counseled on symptoms of withdrawal. Encouraged to contact if worsening symptoms, unusual behavior changes or suicidal thoughts occur.

## 2023-10-31 NOTE — Assessment & Plan Note (Signed)
Scheduled for laparoscopic partial hysterectomy with salpingectomy on February 11th.

## 2023-10-31 NOTE — Assessment & Plan Note (Signed)
Her hypothyroidism is well controlled on Levothyroxine 25 mcg, half tablet daily. Continue.

## 2023-10-31 NOTE — Assessment & Plan Note (Signed)
Reports good sleep with Xanax 0.5mg  at bedtime. No plans to discontinue due to sleep quality and preference. Continue Xanax at bedtime. Refills not needed at this time.

## 2023-11-04 ENCOUNTER — Telehealth: Payer: 59 | Admitting: Physician Assistant

## 2023-11-04 DIAGNOSIS — R3989 Other symptoms and signs involving the genitourinary system: Secondary | ICD-10-CM

## 2023-11-05 MED ORDER — NITROFURANTOIN MONOHYD MACRO 100 MG PO CAPS: 100.0000 mg | ORAL_CAPSULE | Freq: Two times a day (BID) | ORAL | 0 refills | Status: DC

## 2023-11-05 NOTE — Progress Notes (Signed)

## 2023-11-11 ENCOUNTER — Encounter (HOSPITAL_BASED_OUTPATIENT_CLINIC_OR_DEPARTMENT_OTHER): Payer: Self-pay | Admitting: Obstetrics and Gynecology

## 2023-11-11 NOTE — Progress Notes (Addendum)
 Spoke w/ via phone for pre-op interview--- pt Lab needs dos---- urine preg        Lab results------ lab appt 11-13-2023 getting CBC/ T&S/ HIV COVID test -----patient states asymptomatic no test needed Arrive at ------- 0530 on 11-18-2023 NPO after MN w/ exception water  w/ meds Med rec completed Medications to take morning of surgery ----- synthroid  Diabetic medication ----- n/a Patient instructed no nail polish to be worn day of surgery Patient instructed to bring photo id and insurance card day of surgery Patient aware to have Driver (ride ) / caregiver    for 24 hours after surgery - husband, Victoria Russell Patient Special Instructions ----- will pick up bag w/ hibiclens and written instrutions at lab appt. Asked to call w/ any questions Pre-Op special Instructions ----- pt stated last dose mounjaro  10-23-2023, was given instructions to stop prior to surgery and aware not do to again until after surgery Patient verbalized understanding of instructions that were given at this phone interview. Patient denies chest pain, sob, fever, cough at the interview.

## 2023-11-11 NOTE — Progress Notes (Signed)
 Your procedure is scheduled on  :  Tuesday,  11-18-2023  Report to [facility] AT  _5:30__ AM.   Call this number if you have problems the morning of surgery:  [phone] Any questions prior to surgery call pre-op nurse,  Shatasha Lambing:  [phone]   OUR ADDRESS IS [address].  WE ARE LOCATED IN [facility]  PLEASE BRING YOUR INSURANCE CARD AND PHOTO ID DAY OF SURGERY.                                     REMEMBER: Do not eat any food and do not drink any liquid after midnight night before surgery.  This includes no water ,  candy,  gum,  and  mints.   Please brush your teeth morning of surgery and rinse mouth out. _____________________________________________________________________     TAKE ONLY THESE MEDICATIONS MORNING OF SURGERY: May take with sips of water  Synthroid                                        DO NOT WEAR JEWERLY/  METAL/  PIERCINGS (INCLUDING NO PLASTIC PIERCINGS) DO NOT WEAR LOTIONS, POWDERS, PERFUMES OR NAIL POLISH ON YOUR FINGERNAILS. TOENAIL POLISH IS OK TO WEAR. DO NOT SHAVE FOR 48 HOURS PRIOR TO DAY OF SURGERY.  CONTACTS, GLASSES, OR DENTURES MAY NOT BE WORN TO SURGERY.  REMEMBER: NO SMOKING, VAPING ,  DRUGS OR ALCOHOL FOR 24 HOURS BEFORE YOUR SURGERY.                                    [facility] IS NOT RESPONSIBLE  FOR ANY BELONGINGS.                                                                    Victoria Russell           [facility] - Preparing for Surgery Before surgery, you can play an important role.  Because skin is not sterile, your skin needs to be as free of germs as possible.  You can reduce the number of germs on your skin by washing with CHG (chlorahexidine gluconate) soap before surgery.  CHG is an antiseptic cleaner which kills germs and bonds with the skin to continue killing germs even after washing. Please DO NOT use if you have an allergy to CHG or antibacterial soaps.  If your skin becomes  reddened/irritated stop using the CHG and inform your nurse when you arrive at Short Stay. Do not shave (including legs and underarms) for at least 48 hours prior to the first CHG shower.  You may shave your face/neck. Please follow these instructions carefully:  1.  Shower with CHG Soap the night before surgery and the  morning of Surgery.  2.  If you choose to wash your hair, wash your hair first as usual with your  normal  shampoo.  3.  After you shampoo, rinse your hair and body thoroughly to remove the  shampoo.                                        4.  Use CHG as you would any other liquid soap.  You can apply chg directly  to the skin and wash , chg soap provided, night before and morning of your surgery.  5.  Apply the CHG Soap to your body ONLY FROM THE NECK DOWN.   Do not use on face/ open                           Wound or open sores. Avoid contact with eyes, ears mouth and genitals (private parts).                       Wash face,  Genitals (private parts) with your normal soap.             6.  Wash thoroughly, paying special attention to the area where your surgery  will be performed.  7.  Thoroughly rinse your body with warm water  from the neck down.  8.  DO NOT shower/wash with your normal soap after using and rinsing off  the CHG Soap.             9.  Pat yourself dry with a clean towel.            10.  Wear clean pajamas.            11.  Place clean sheets on your bed the night of your first shower and do not  sleep with pets. Day of Surgery : Do not apply any lotions/ powders the morning of surgery.  Please wear clean clothes to the hospital/surgery center.  IF YOU HAVE ANY SKIN IRRITATION OR PROBLEMS WITH THE SURGICAL SOAP, PLEASE GET A BAR OF GOLD DIAL SOAP AND SHOWER THE NIGHT BEFORE YOUR SURGERY AND THE MORNING OF YOUR SURGERY. PLEASE LET THE NURSE KNOW MORNING OF YOUR SURGERY IF YOU HAD ANY PROBLEMS WITH THE SURGICAL SOAP.   YOUR SURGEON MAY HAVE REQUESTED EXTENDED  RECOVERY TIME AFTER YOUR SURGERY. IT COULD BE A  JUST A FEW HOURS  UP TO AN OVERNIGHT STAY.  YOUR SURGEON SHOULD HAVE DISCUSSED THIS WITH YOU PRIOR TO YOUR SURGERY. IN THE EVENT YOU NEED TO STAY OVERNIGHT PLEASE REFER TO THE FOLLOWING GUIDELINES. YOU MAY HAVE UP TO 4 VISITORS  MAY VISIT IN THE EXTENDED RECOVERY ROOM UNTIL 800 PM ONLY.  ONE  VISITOR AGE 36 AND OVER MAY SPEND THE NIGHT AND MUST BE IN EXTENDED RECOVERY ROOM NO LATER THAN 800 PM . YOUR DISCHARGE TIME AFTER YOU SPEND THE NIGHT IS 900 AM THE MORNING AFTER YOUR SURGERY. YOU MAY PACK A SMALL OVERNIGHT BAG WITH TOILETRIES FOR YOUR OVERNIGHT STAY IF YOU WISH.  REGARDLESS OF IF YOU STAY OVER NIGHT OR ARE DISCHARGED THE SAME DAY YOU WILL BE REQUIRED TO HAVE A RESPONSIBLE ADULT (18 YRS OLD OR OLDER) STAY WITH YOU FOR AT LEAST THE FIRST 32 HOURS WHEN HOME.  YOUR PRESCRIPTION MEDICATIONS WILL BE PROVIDED DURING [facility] STAY.  ________________________________________________________________________

## 2023-11-13 ENCOUNTER — Encounter (HOSPITAL_COMMUNITY)
Admission: RE | Admit: 2023-11-13 | Discharge: 2023-11-13 | Disposition: A | Discharge status: Home / Self Care | Payer: 59 | Source: Ambulatory Visit | Attending: Obstetrics and Gynecology | Admitting: Obstetrics and Gynecology

## 2023-11-13 DIAGNOSIS — Z01818 Encounter for other preprocedural examination: Secondary | ICD-10-CM | POA: Insufficient documentation

## 2023-11-13 DIAGNOSIS — R102 Pelvic and perineal pain: Secondary | ICD-10-CM | POA: Insufficient documentation

## 2023-11-13 LAB — CBC
HCT: 42.9 % (ref 36.0–46.0)
Hemoglobin: 14.2 g/dL (ref 12.0–15.0)
MCH: 30.5 pg (ref 26.0–34.0)
MCHC: 33.1 g/dL (ref 30.0–36.0)
MCV: 92.3 fL (ref 80.0–100.0)
Platelets: 260 10*3/uL (ref 150–400)
RBC: 4.65 MIL/uL (ref 3.87–5.11)
RDW: 13 % (ref 11.5–15.5)
WBC: 6.5 10*3/uL (ref 4.0–10.5)
nRBC: 0 % (ref 0.0–0.2)

## 2023-11-13 LAB — HIV ANTIBODY (ROUTINE TESTING W REFLEX): HIV Screen 4th Generation wRfx: NONREACTIVE

## 2023-11-16 NOTE — H&P (Signed)
 Gynecology History and Physical Pre-admission H&P for scheduled procedure  Victoria Russell is a 38 y.o. female G2P2 presenting for scheduled LAVH BS for pelvic pain.  She has a history of pelvic pain and endometriosis and AUB. She underwent tubal ligation and endometrial ablation in 2020.  At this time, small endometriotic implants were noted in posterior cul de sac.    She has had progressive return of pelvic pain, which is disruptive to her daily living. She desires surgical management with hysterectomy.    OB History     Gravida  2   Para  2   Term  2   Preterm      AB      Living  2      SAB      IAB      Ectopic      Multiple  0   Live Births  2          Past Medical History:  Diagnosis Date   ADHD (attention deficit hyperactivity disorder)    Chronic pelvic pain in female    Endometriosis    Family history of Wolff-Parkinson-White (WPW) syndrome    mother diagnosis   GAD (generalized anxiety disorder)    History of cardiac arrhythmia    (11-11-2023 mother dx WPW syndrome;   per pt started symptoms age 24 ,  work-up by cardiology had many monitor's and work-up but never could capture WPW rhythm ,  stated usually happened when playing hard core sports,  symptoms stopped age 83 with no issues since   History of kidney stones    History of pregnancy induced hypertension    HLD (hyperlipidemia)    diet controlled   Hx of varicella    Hyperhidrosis    Hypothyroidism    Wears glasses    Past Surgical History:  Procedure Laterality Date   BREAST ENHANCEMENT SURGERY Bilateral 2021   implants   CESAREAN SECTION N/A 01/29/2013   Procedure: CESAREAN SECTION;  Surgeon: Rosaline LITTIE Cobble, MD;  Location: [facility];  Service: Obstetrics;  Laterality: N/A;   CESAREAN SECTION N/A 08/22/2014   Procedure: CESAREAN SECTION;  Surgeon: Rosaline LITTIE Cobble, MD;  Location: [facility];  Service: Obstetrics;  Laterality: N/A;  Repeat edc 08/29/14   COLONOSCOPY WITH PROPOFOL    11/09/2014   dr abran   COMBINED LAPAROSCOPY W/ HYSTEROSCOPY  09/2019   @ [facility] by dr cobble;   LAPROSCOPY BILATERAL TUBAL LIGATION AND D&C HYSTEROSCOPY W/ ENDOMETRIAL ABLATION   COSMETIC SURGERY  11/2022   CHIN IMPLANT W/ SOLID SILICONE   CYSTOSCOPY/RETROGRADE/URETEROSCOPY Right 07/10/2010   @[facility]   by dr mardy   TONSILLECTOMY AND ADENOIDECTOMY  1998   TYMPANOSTOMY TUBE PLACEMENT Bilateral    CHILD   WISDOM TOOTH EXTRACTION     Family History: family history includes Basal cell carcinoma in her mother; Breast cancer (age of onset: 41) in her mother; Cervical cancer in her maternal grandmother; Colon polyps in her father and mother; Coronary artery disease in her paternal grandmother; Depression in her father; Diabetes in her maternal grandfather and paternal grandmother; Heart disease in her paternal grandmother; Hypothyroidism in her mother; Other in her sister; Wolff Parkinson White syndrome in her mother. Social History:  reports that she has never smoked. She has never used smokeless tobacco. She reports that she does not currently use alcohol. She reports that she does not currently use drugs after having used the following drugs: Marijuana.   Review of Systems - Patient denies  fever, chills, SOB, CP, N/V/D.  History   Height 5' 4 (1.626 m), weight 63.5 kg, last menstrual period 09/15/2019. Exam Physical Exam   Gen: alert, well appearing, no distress Chest: nonlabored breathing CV: no peripheral edema Abdomen: soft, nontender Ext: no evidence of DVT    Assessment/Plan: Admit for planned procedure Discussed recommendation for laparoscopic assisted vaginal hysterectomy given history of endometriosis, tubal ligation, and Cesarean deliveries.  Also discussed risks, benefits, recovery, and limitations including inadequate pain relief. Discussed recommendation for retention of ovaries. Risks include but are not limited to bleeding, infection, damage to nearby organs (bowel,  bladder, ureter), and need to convert to open procedure.  Recovery and care discussed.   Victoria Russell 11/16/2023, 11:50 AM

## 2023-11-17 NOTE — Anesthesia Preprocedure Evaluation (Addendum)
Anesthesia Evaluation  Patient identified by MRN, date of birth, ID band Patient awake    Reviewed: Allergy & Precautions, NPO status , Patient's Chart, lab work & pertinent test results  Airway Mallampati: III  TM Distance: <3 FB Neck ROM: Full  Mouth opening: Limited Mouth Opening Comment: Chin implant Dental no notable dental hx. (+) Teeth Intact   Pulmonary    Pulmonary exam normal breath sounds clear to auscultation       Cardiovascular negative cardio ROS Normal cardiovascular exam Rhythm:Regular Rate:Normal     Neuro/Psych  PSYCHIATRIC DISORDERS Anxiety     negative neurological ROS     GI/Hepatic negative GI ROS, Neg liver ROS,,,  Endo/Other  diabetes, Type 2Hypothyroidism    Renal/GU Renal disease     Musculoskeletal   Abdominal   Peds  Hematology   Anesthesia Other Findings   Reproductive/Obstetrics                             Anesthesia Physical Anesthesia Plan  ASA: 2  Anesthesia Plan: General   Post-op Pain Management: Toradol IV (intra-op)* and Dilaudid IV   Induction: Intravenous  PONV Risk Score and Plan: 4 or greater and Treatment may vary due to age or medical condition, Midazolam, Ondansetron and Dexamethasone  Airway Management Planned: Oral ETT and Video Laryngoscope Planned  Additional Equipment: None  Intra-op Plan:   Post-operative Plan: Extubation in OR  Informed Consent: I have reviewed the patients History and Physical, chart, labs and discussed the procedure including the risks, benefits and alternatives for the proposed anesthesia with the patient or authorized representative who has indicated his/her understanding and acceptance.     Dental advisory given  Plan Discussed with: CRNA and Surgeon  Anesthesia Plan Comments:         Anesthesia Quick Evaluation

## 2023-11-18 ENCOUNTER — Encounter (HOSPITAL_BASED_OUTPATIENT_CLINIC_OR_DEPARTMENT_OTHER)
Admission: RE | Disposition: A | Discharge status: Home / Self Care | Payer: Self-pay | Source: Ambulatory Visit | Attending: Obstetrics and Gynecology

## 2023-11-18 ENCOUNTER — Other Ambulatory Visit: Payer: Self-pay

## 2023-11-18 ENCOUNTER — Other Ambulatory Visit (HOSPITAL_COMMUNITY): Payer: Self-pay

## 2023-11-18 ENCOUNTER — Ambulatory Visit (HOSPITAL_BASED_OUTPATIENT_CLINIC_OR_DEPARTMENT_OTHER): Payer: Self-pay | Admitting: Anesthesiology

## 2023-11-18 ENCOUNTER — Encounter (HOSPITAL_BASED_OUTPATIENT_CLINIC_OR_DEPARTMENT_OTHER): Payer: Self-pay | Admitting: Obstetrics and Gynecology

## 2023-11-18 ENCOUNTER — Observation Stay (HOSPITAL_BASED_OUTPATIENT_CLINIC_OR_DEPARTMENT_OTHER)
Admission: RE | Admit: 2023-11-18 | Discharge: 2023-11-18 | Disposition: A | Discharge status: Home / Self Care | Payer: 59 | Source: Ambulatory Visit | Attending: Obstetrics and Gynecology | Admitting: Obstetrics and Gynecology

## 2023-11-18 DIAGNOSIS — I10 Essential (primary) hypertension: Secondary | ICD-10-CM | POA: Diagnosis not present

## 2023-11-18 DIAGNOSIS — N736 Female pelvic peritoneal adhesions (postinfective): Secondary | ICD-10-CM

## 2023-11-18 DIAGNOSIS — E039 Hypothyroidism, unspecified: Secondary | ICD-10-CM | POA: Diagnosis not present

## 2023-11-18 DIAGNOSIS — R102 Pelvic and perineal pain: Principal | ICD-10-CM | POA: Diagnosis present

## 2023-11-18 DIAGNOSIS — N809 Endometriosis, unspecified: Secondary | ICD-10-CM | POA: Diagnosis not present

## 2023-11-18 DIAGNOSIS — N8000 Endometriosis of the uterus, unspecified: Principal | ICD-10-CM | POA: Insufficient documentation

## 2023-11-18 DIAGNOSIS — K660 Peritoneal adhesions (postprocedural) (postinfection): Secondary | ICD-10-CM | POA: Insufficient documentation

## 2023-11-18 DIAGNOSIS — Z01818 Encounter for other preprocedural examination: Secondary | ICD-10-CM

## 2023-11-18 HISTORY — DX: Endometriosis, unspecified: N80.9

## 2023-11-18 HISTORY — PX: CYSTOSCOPY: SHX5120

## 2023-11-18 HISTORY — DX: Other chronic pain: G89.29

## 2023-11-18 HISTORY — DX: Personal history of other complications of pregnancy, childbirth and the puerperium: Z87.59

## 2023-11-18 HISTORY — DX: Family history of ischemic heart disease and other diseases of the circulatory system: Z82.49

## 2023-11-18 HISTORY — DX: Generalized anxiety disorder: F41.1

## 2023-11-18 HISTORY — DX: Personal history of other diseases of the circulatory system: Z86.79

## 2023-11-18 HISTORY — DX: Presence of spectacles and contact lenses: Z97.3

## 2023-11-18 HISTORY — PX: LAPAROSCOPIC VAGINAL HYSTERECTOMY WITH SALPINGECTOMY: SHX6680

## 2023-11-18 LAB — TYPE AND SCREEN
ABO/RH(D): A POS
Antibody Screen: NEGATIVE

## 2023-11-18 LAB — POCT PREGNANCY, URINE: Preg Test, Ur: NEGATIVE

## 2023-11-18 SURGERY — HYSTERECTOMY, VAGINAL, LAPAROSCOPY-ASSISTED, WITH SALPINGECTOMY
Anesthesia: General | Site: Bladder

## 2023-11-18 MED ORDER — ALUM & MAG HYDROXIDE-SIMETH 200-200-20 MG/5ML PO SUSP: 30.0000 mL | ORAL | Status: DC | PRN

## 2023-11-18 MED ORDER — IBUPROFEN 200 MG PO TABS: 600.0000 mg | ORAL_TABLET | Freq: Four times a day (QID) | ORAL | Status: DC

## 2023-11-18 MED ORDER — KETOROLAC TROMETHAMINE 30 MG/ML IJ SOLN
30.0000 mg | Freq: Once | INTRAMUSCULAR | Status: AC | PRN
  Administered 2023-11-18: 30 mg via INTRAVENOUS

## 2023-11-18 MED ORDER — ONDANSETRON HCL 4 MG PO TABS
4.0000 mg | ORAL_TABLET | Freq: Three times a day (TID) | ORAL | 0 refills | Status: DC | PRN
  Filled 2023-11-18: qty 12, 4d supply, fill #0

## 2023-11-18 MED ORDER — ALPRAZOLAM 0.5 MG PO TABS: 0.5000 mg | ORAL_TABLET | Freq: Every evening | ORAL | Status: DC | PRN

## 2023-11-18 MED ORDER — SUGAMMADEX SODIUM 200 MG/2ML IV SOLN
INTRAVENOUS | Status: DC | PRN
  Administered 2023-11-18: 150 mg via INTRAVENOUS

## 2023-11-18 MED ORDER — FLUORESCEIN SODIUM 10 % IV SOLN
INTRAVENOUS | Status: DC | PRN
  Administered 2023-11-18: 10 mg via INTRAVENOUS

## 2023-11-18 MED ORDER — ONDANSETRON HCL 4 MG/2ML IJ SOLN: 4.0000 mg | Freq: Once | INTRAMUSCULAR | Status: DC | PRN

## 2023-11-18 MED ORDER — ACETAMINOPHEN 10 MG/ML IV SOLN
INTRAVENOUS | Status: AC
  Filled 2023-11-18: qty 100

## 2023-11-18 MED ORDER — OXYCODONE HCL 5 MG PO TABS
ORAL_TABLET | ORAL | Status: AC
  Filled 2023-11-18: qty 1

## 2023-11-18 MED ORDER — VENLAFAXINE HCL ER 37.5 MG PO CP24: 37.5000 mg | ORAL_CAPSULE | Freq: Every day | ORAL | Status: DC

## 2023-11-18 MED ORDER — ACETAMINOPHEN 325 MG PO TABS: 650.0000 mg | ORAL_TABLET | ORAL | Status: DC | PRN

## 2023-11-18 MED ORDER — OXYCODONE HCL 5 MG PO TABS
5.0000 mg | ORAL_TABLET | ORAL | Status: DC | PRN
Start: 2023-11-18 — End: 2023-11-19
  Administered 2023-11-18 (×2): 5 mg via ORAL

## 2023-11-18 MED ORDER — SCOPOLAMINE 1 MG/3DAYS TD PT72
1.0000 | MEDICATED_PATCH | TRANSDERMAL | Status: DC
  Administered 2023-11-18: 1.5 mg via TRANSDERMAL

## 2023-11-18 MED ORDER — CEFAZOLIN SODIUM-DEXTROSE 2-4 GM/100ML-% IV SOLN
INTRAVENOUS | Status: AC
  Filled 2023-11-18: qty 100

## 2023-11-18 MED ORDER — OXYCODONE HCL 5 MG PO TABS
5.0000 mg | ORAL_TABLET | ORAL | 0 refills | Status: AC | PRN
  Filled 2023-11-18: qty 20, 4d supply, fill #0

## 2023-11-18 MED ORDER — ROCURONIUM BROMIDE 10 MG/ML (PF) SYRINGE
PREFILLED_SYRINGE | INTRAVENOUS | Status: AC
  Filled 2023-11-18: qty 10

## 2023-11-18 MED ORDER — ACETAMINOPHEN 325 MG PO TABS
650.0000 mg | ORAL_TABLET | ORAL | 0 refills | Status: DC | PRN
  Filled 2023-11-18: qty 30, 3d supply, fill #0

## 2023-11-18 MED ORDER — HEMOSTATIC AGENTS (NO CHARGE) OPTIME
TOPICAL | Status: DC | PRN
  Administered 2023-11-18: 1 via TOPICAL

## 2023-11-18 MED ORDER — STERILE WATER FOR IRRIGATION IR SOLN
Status: DC | PRN
  Administered 2023-11-18: 500 mL
  Administered 2023-11-18: 1000 mL

## 2023-11-18 MED ORDER — OXYCODONE HCL 5 MG/5ML PO SOLN: 5.0000 mg | Freq: Once | ORAL | Status: DC | PRN

## 2023-11-18 MED ORDER — DEXAMETHASONE SODIUM PHOSPHATE 10 MG/ML IJ SOLN
INTRAMUSCULAR | Status: AC
  Filled 2023-11-18: qty 1

## 2023-11-18 MED ORDER — MIDAZOLAM HCL 2 MG/2ML IJ SOLN
INTRAMUSCULAR | Status: DC | PRN
  Administered 2023-11-18: 2 mg via INTRAVENOUS

## 2023-11-18 MED ORDER — MENTHOL 3 MG MT LOZG: 1.0000 | LOZENGE | OROMUCOSAL | Status: DC | PRN

## 2023-11-18 MED ORDER — HYDROMORPHONE HCL 2 MG/ML IJ SOLN
INTRAMUSCULAR | Status: AC
  Filled 2023-11-18: qty 1

## 2023-11-18 MED ORDER — ONDANSETRON HCL 4 MG/2ML IJ SOLN
INTRAMUSCULAR | Status: AC
  Filled 2023-11-18: qty 2

## 2023-11-18 MED ORDER — LACTATED RINGERS IV SOLN: INTRAVENOUS | Status: DC

## 2023-11-18 MED ORDER — HYDROMORPHONE HCL 1 MG/ML IJ SOLN
0.2500 mg | INTRAMUSCULAR | Status: DC | PRN
  Administered 2023-11-18 (×4): 0.5 mg via INTRAVENOUS

## 2023-11-18 MED ORDER — CEFAZOLIN SODIUM-DEXTROSE 2-4 GM/100ML-% IV SOLN
2.0000 g | INTRAVENOUS | Status: AC
  Administered 2023-11-18: 2 g via INTRAVENOUS

## 2023-11-18 MED ORDER — ONDANSETRON HCL 4 MG/2ML IJ SOLN
INTRAMUSCULAR | Status: DC | PRN
  Administered 2023-11-18: 4 mg via INTRAVENOUS

## 2023-11-18 MED ORDER — HYDROMORPHONE HCL 1 MG/ML IJ SOLN
INTRAMUSCULAR | Status: AC
  Filled 2023-11-18: qty 1

## 2023-11-18 MED ORDER — ACETAMINOPHEN 10 MG/ML IV SOLN
1000.0000 mg | Freq: Once | INTRAVENOUS | Status: AC
  Administered 2023-11-18: 1000 mg via INTRAVENOUS

## 2023-11-18 MED ORDER — LEVOTHYROXINE SODIUM 25 MCG PO TABS: 12.5000 ug | ORAL_TABLET | Freq: Every day | ORAL | Status: DC

## 2023-11-18 MED ORDER — SCOPOLAMINE 1 MG/3DAYS TD PT72
MEDICATED_PATCH | TRANSDERMAL | Status: AC
  Filled 2023-11-18: qty 1

## 2023-11-18 MED ORDER — HYDROMORPHONE HCL 1 MG/ML IJ SOLN
INTRAMUSCULAR | Status: DC | PRN
  Administered 2023-11-18 (×2): .5 mg via INTRAVENOUS

## 2023-11-18 MED ORDER — KETOROLAC TROMETHAMINE 30 MG/ML IJ SOLN
INTRAMUSCULAR | Status: AC
  Filled 2023-11-18: qty 1

## 2023-11-18 MED ORDER — FENTANYL CITRATE (PF) 250 MCG/5ML IJ SOLN
INTRAMUSCULAR | Status: DC | PRN
  Administered 2023-11-18 (×4): 25 ug via INTRAVENOUS
  Administered 2023-11-18: 50 ug via INTRAVENOUS
  Administered 2023-11-18: 100 ug via INTRAVENOUS

## 2023-11-18 MED ORDER — ONDANSETRON HCL 4 MG/2ML IJ SOLN
INTRAMUSCULAR | Status: AC
Start: 2023-11-18 — End: ?
  Filled 2023-11-18: qty 2

## 2023-11-18 MED ORDER — SENNOSIDES-DOCUSATE SODIUM 8.6-50 MG PO TABS: 1.0000 | ORAL_TABLET | Freq: Every evening | ORAL | Status: DC | PRN

## 2023-11-18 MED ORDER — SODIUM CHLORIDE 0.9 % IR SOLN
Status: DC | PRN
  Administered 2023-11-18: 1000 mL
  Administered 2023-11-18: 1000 mL via INTRAVESICAL

## 2023-11-18 MED ORDER — OXYCODONE HCL 5 MG PO TABS: 5.0000 mg | ORAL_TABLET | Freq: Once | ORAL | Status: DC | PRN

## 2023-11-18 MED ORDER — IBUPROFEN 600 MG PO TABS
600.0000 mg | ORAL_TABLET | Freq: Four times a day (QID) | ORAL | 0 refills | Status: DC
  Filled 2023-11-18: qty 30, 8d supply, fill #0

## 2023-11-18 MED ORDER — PROPOFOL 10 MG/ML IV BOLUS
INTRAVENOUS | Status: DC | PRN
  Administered 2023-11-18: 150 mg via INTRAVENOUS

## 2023-11-18 MED ORDER — SIMETHICONE 80 MG PO CHEW: 80.0000 mg | CHEWABLE_TABLET | Freq: Four times a day (QID) | ORAL | Status: DC | PRN

## 2023-11-18 MED ORDER — MIDAZOLAM HCL 2 MG/2ML IJ SOLN
INTRAMUSCULAR | Status: AC
  Filled 2023-11-18: qty 2

## 2023-11-18 MED ORDER — LIDOCAINE 2% (20 MG/ML) 5 ML SYRINGE
INTRAMUSCULAR | Status: DC | PRN
  Administered 2023-11-18: 100 mg via INTRAVENOUS

## 2023-11-18 MED ORDER — ZOLPIDEM TARTRATE 5 MG PO TABS: 5.0000 mg | ORAL_TABLET | Freq: Every evening | ORAL | Status: DC | PRN

## 2023-11-18 MED ORDER — ROCURONIUM BROMIDE 10 MG/ML (PF) SYRINGE
PREFILLED_SYRINGE | INTRAVENOUS | Status: DC | PRN
  Administered 2023-11-18: 20 mg via INTRAVENOUS
  Administered 2023-11-18: 10 mg via INTRAVENOUS
  Administered 2023-11-18: 50 mg via INTRAVENOUS
  Administered 2023-11-18: 20 mg via INTRAVENOUS

## 2023-11-18 MED ORDER — FENTANYL CITRATE (PF) 250 MCG/5ML IJ SOLN
INTRAMUSCULAR | Status: AC
  Filled 2023-11-18: qty 5

## 2023-11-18 MED ORDER — OXYCODONE HCL 5 MG PO TABS
ORAL_TABLET | ORAL | Status: AC
Start: 2023-11-18 — End: ?
  Filled 2023-11-18: qty 1

## 2023-11-18 MED ORDER — POVIDONE-IODINE 10 % EX SWAB: 2.0000 | Freq: Once | CUTANEOUS | Status: DC

## 2023-11-18 MED ORDER — LIDOCAINE HCL (PF) 2 % IJ SOLN
INTRAMUSCULAR | Status: AC
  Filled 2023-11-18: qty 5

## 2023-11-18 MED ORDER — PROPOFOL 10 MG/ML IV BOLUS
INTRAVENOUS | Status: AC
  Filled 2023-11-18: qty 20

## 2023-11-18 MED ORDER — DEXAMETHASONE SODIUM PHOSPHATE 10 MG/ML IJ SOLN
INTRAMUSCULAR | Status: DC | PRN
  Administered 2023-11-18: 10 mg via INTRAVENOUS

## 2023-11-18 MED ORDER — BUPIVACAINE HCL (PF) 0.5 % IJ SOLN
INTRAMUSCULAR | Status: DC | PRN
  Administered 2023-11-18: 8 mL

## 2023-11-18 SURGICAL SUPPLY — 49 items
APPLICATOR ARISTA FLEXITIP XL (MISCELLANEOUS) IMPLANT
CABLE HIGH FREQUENCY MONO STRZ (ELECTRODE) IMPLANT
CATH ROBINSON RED A/P 16FR (CATHETERS) ×2 IMPLANT
COVER BACK TABLE 60X90IN (DRAPES) ×2 IMPLANT
COVER MAYO STAND STRL (DRAPES) ×2 IMPLANT
DERMABOND ADVANCED .7 DNX12 (GAUZE/BANDAGES/DRESSINGS) ×2 IMPLANT
DRAPE SURG IRRIG POUCH 19X23 (DRAPES) ×2 IMPLANT
DURAPREP 26ML APPLICATOR (WOUND CARE) ×2 IMPLANT
ELECT REM PT RETURN 9FT ADLT (ELECTROSURGICAL) ×2 IMPLANT
ELECTRODE REM PT RTRN 9FT ADLT (ELECTROSURGICAL) IMPLANT
GAUZE 4X4 16PLY ~~LOC~~+RFID DBL (SPONGE) ×2 IMPLANT
GLOVE BIO SURGEON STRL SZ7 (GLOVE) ×4 IMPLANT
GLOVE BIOGEL PI IND STRL 6.5 (GLOVE) ×2 IMPLANT
GLOVE BIOGEL PI IND STRL 7.5 (GLOVE) ×4 IMPLANT
GLOVE SURG SYN 6.5 ES PF (GLOVE) ×8 IMPLANT
GLOVE SURG SYN 6.5 PF PI (GLOVE) IMPLANT
GOWN STRL REUS W/ TWL LRG LVL3 (GOWN DISPOSABLE) IMPLANT
GOWN STRL REUS W/TWL LRG LVL3 (GOWN DISPOSABLE) ×6 IMPLANT
HEMOSTAT ARISTA ABSORB 3G PWDR (HEMOSTASIS) IMPLANT
IV NS 1000ML BAXH (IV SOLUTION) IMPLANT
KIT PINK PAD W/HEAD ARE REST (MISCELLANEOUS) ×2 IMPLANT
KIT PINK PAD W/HEAD ARM REST (MISCELLANEOUS) ×2 IMPLANT
KIT TURNOVER CYSTO (KITS) ×2 IMPLANT
LEGGING LITHOTOMY PAIR STRL (DRAPES) ×2 IMPLANT
LIGASURE BLUNT TIP 5 LONG 44CM (ELECTROSURGICAL) ×2 IMPLANT
LIGASURE IMPACT 36 18CM CVD LR (INSTRUMENTS) IMPLANT
NS IRRIG 1000ML POUR BTL (IV SOLUTION) ×2 IMPLANT
PACK LAVH (CUSTOM PROCEDURE TRAY) ×2 IMPLANT
PAD PREP 24X48 CUFFED NSTRL (MISCELLANEOUS) ×2 IMPLANT
SCISSORS LAP 5X45 EPIX DISP (ENDOMECHANICALS) IMPLANT
SET CYSTO W/LG BORE CLAMP LF (SET/KITS/TRAYS/PACK) IMPLANT
SET IRRIG Y TYPE TUR BLADDER L (SET/KITS/TRAYS/PACK) IMPLANT
SET TUBE SMOKE EVAC HIGH FLOW (TUBING) ×2 IMPLANT
SLEEVE SCD COMPRESS KNEE MED (STOCKING) ×2 IMPLANT
SLEEVE Z-THREAD 5X100MM (TROCAR) IMPLANT
SPIKE FLUID TRANSFER (MISCELLANEOUS) ×2 IMPLANT
SUT MNCRL 0 VIOLET 6X18 (SUTURE) IMPLANT
SUT MON AB 4-0 PS1 27 (SUTURE) ×2 IMPLANT
SUT VIC AB 0 CT1 18XCR BRD8 (SUTURE) ×2 IMPLANT
SUT VIC AB 0 CT1 27XBRD ANBCTR (SUTURE) ×4 IMPLANT
SUT VICRYL 0 UR6 27IN ABS (SUTURE) ×2 IMPLANT
TOWEL OR 17X24 6PK STRL BLUE (TOWEL DISPOSABLE) ×2 IMPLANT
TRAY FOLEY W/BAG SLVR 14FR LF (SET/KITS/TRAYS/PACK) ×2 IMPLANT
TROCAR Z-THREAD FIOS 11X100 BL (TROCAR) ×2 IMPLANT
TROCAR Z-THREAD FIOS 5X100MM (TROCAR) ×2 IMPLANT
UNDERPAD 30X36 HEAVY ABSORB (UNDERPADS AND DIAPERS) ×2 IMPLANT
WARMER LAPAROSCOPE (MISCELLANEOUS) ×2 IMPLANT
WATER STERILE IRR 1000ML POUR (IV SOLUTION) IMPLANT
WATER STERILE IRR 500ML POUR (IV SOLUTION) IMPLANT

## 2023-11-18 NOTE — Anesthesia Postprocedure Evaluation (Signed)
Anesthesia Post Note  Patient: RUSSIE GULLEDGE  Procedure(s) Performed: LAPAROSCOPIC ASSISTED VAGINAL HYSTERECTOMY WITH SALPINGECTOMY (Bilateral: Abdomen) CYSTOSCOPY (Bladder)     Patient location during evaluation: PACU Anesthesia Type: General Level of consciousness: awake and alert Pain management: pain level controlled Vital Signs Assessment: post-procedure vital signs reviewed and stable Respiratory status: spontaneous breathing, nonlabored ventilation, respiratory function stable and patient connected to nasal cannula oxygen Cardiovascular status: blood pressure returned to baseline and stable Postop Assessment: no apparent nausea or vomiting Anesthetic complications: no  No notable events documented.  Last Vitals:  Vitals:   11/18/23 1300 11/18/23 1400  BP: 124/80 105/83  Pulse: 88 82  Resp: 16 16  Temp: 36.7 C 36.8 C  SpO2: 96% 98%    Last Pain:  Vitals:   11/18/23 1400  TempSrc:   PainSc: 6                  Trevor Iha

## 2023-11-18 NOTE — Brief Op Note (Signed)
11/18/2023  10:49 AM  PATIENT:  Victoria Russell  38 y.o. female  PRE-OPERATIVE DIAGNOSIS:  PELVIC PAIN  POST-OPERATIVE DIAGNOSIS:  PELVIC PAIN, endometriosis, pelvic adhesions  PROCEDURE:  Procedure(s): LAPAROSCOPIC ASSISTED VAGINAL HYSTERECTOMY WITH SALPINGECTOMY (Bilateral) CYSTOSCOPY (N/A)  SURGEON:  Surgeons and Role:    Lyn Henri, MD - Primary  ASSISTANTS: Dr Zelphia Cairo   ANESTHESIA:   general  EBL:  400 mL   BLOOD ADMINISTERED:none  DRAINS: Foley   LOCAL MEDICATIONS USED:  LIDOCAINE   SPECIMEN:  uterus, bialteral fallopian tubes, cerix  DISPOSITION OF SPECIMEN:  PATHOLOGY  COUNTS:  YES  TOURNIQUET:  * No tourniquets in log *  DICTATION: .Note written in EPIC  PLAN OF CARE: Admit for overnight observation  PATIENT DISPOSITION:  PACU - hemodynamically stable.   Delay start of Pharmacological VTE agent (>24hrs) due to surgical blood loss or risk of bleeding: yes

## 2023-11-18 NOTE — Progress Notes (Signed)
No changes to above H&P.  BP (!) 127/91   Pulse 83   Temp 97.7 F (36.5 C) (Oral)   Resp 16   Ht 5\' 4"  (1.626 m)   Wt 67.6 kg   LMP 09/15/2019 (Approximate) Comment: PER PT HAD ENDOMETRIAL ABLATION  SpO2 99%   BMI 25.58 kg/m   Procedure again reviewed, including risks, indications, recovery.   Will proceed as able.  Nilda Simmer

## 2023-11-18 NOTE — Transfer of Care (Signed)
Immediate Anesthesia Transfer of Care Note  Patient: Victoria Russell  Procedure(s) Performed: LAPAROSCOPIC ASSISTED VAGINAL HYSTERECTOMY WITH SALPINGECTOMY (Bilateral: Abdomen) CYSTOSCOPY (Bladder)  Patient Location: PACU  Anesthesia Type:General  Level of Consciousness: awake, alert , and oriented  Airway & Oxygen Therapy: Patient Spontanous Breathing and Patient connected to nasal cannula oxygen  Post-op Assessment: Report given to RN and Post -op Vital signs reviewed and stable  Post vital signs: Reviewed and stable  Last Vitals:  Vitals Value Taken Time  BP 132/84 11/18/23 1104  Temp 37.1 C 11/18/23 1104  Pulse 101 11/18/23 1107  Resp 11 11/18/23 1107  SpO2 99 % 11/18/23 1107  Vitals shown include unfiled device data.  Last Pain:  Vitals:   11/18/23 1108  TempSrc:   PainSc: 8       Patients Stated Pain Goal: 5 (11/18/23 1610)  Complications: No notable events documented.

## 2023-11-18 NOTE — Progress Notes (Signed)
Post Op Check  Patient seen around 1 pm, reported well controlled pain, denied nausea, vomiting, CP, SOB.  BP 108/77 (BP Location: Right Arm)   Pulse 70   Temp 97.8 F (36.6 C)   Resp 16   Ht 5\' 4"  (1.626 m)   Wt 67.6 kg   LMP 09/15/2019 (Approximate) Comment: PER PT HAD ENDOMETRIAL ABLATION  SpO2 100%   BMI 25.58 kg/m   Gen: alert, well appearing, no distress Chest: nonlabored breathing CV: no peripheral edema Abdomen: soft, nondistended, ATTP Ext: no evidence of DVT  Received an update from the floor, patient is desiring discharge home tonight. Since, she has had foley removed, but awaiting spontaneous void. She is tolerating PO and has ambulated without difficulty.  UO has been adequate and strong ureteral jets were noted on cystoscopy intraoperatively.  Will consider evening discharge if she is able to void spontaneously and without issue. If so, will follow up by phone tomorrow.   Nilda Simmer

## 2023-11-18 NOTE — Op Note (Signed)
Operative Note  PREOPERATIVE DIAGNOSES: 1. Pelvic pain 2. Endometriosis 3. S/p endometrial ablation  POSTOPERATIVE DIAGNOSES: 1. Same 2. Pelvic adhesions  PROCEDURE PERFORMED: Laparoscopic-assisted total vaginal hysterectomy with bilateral salpingectomy, lysis of adhesions, cystoscopy  SURGEON: Dr. Nilda Simmer ASSISTANT: Dr. Zelphia Cairo  ANESTHESIA: General   ESTIMATED BLOOD LOSS: 400 mL  URINE OUTPUT: Clear urine at the end of the procedure.  FLUIDS: Per anesthesia record  COMPLICATIONS: None   TUBES: None.  DRAINS: Foley to gravity  PATHOLOGY: Uterus, cervix, bilateral fallopian tubes, were sent to pathology for review.  FINDINGS: On exam, under anesthesia, normal appearing vulva and vagina, a normal sized uterus  Operative findings demonstrated a normal sized uterus. Fallopian tubes were s/p tubal ligation.  Extensive ahdesions were noted along the anterior lower uterine segment and along the pelvic sidewalls, especially on patient left, as well as cul-de-sac. Ovaries were normal appearing. The appendix was normal appearing. The bowel and omentum were normal appearing. Post-hysterectomy cystoscopy demonstrated vigorous ureteral jets with IV dye (Flourescein).  Procedure: A general anesthesia was induced and the patient was placed in the dirsal lithotomy position. The abdomen, perineum, and vagina were prepped and draped in the usual fashion. A foley catheter inserted into the bladder and attached to straight drainage. After the initial preparation, the procedure commenced at the vagina. With a speculum in place to visualize the cervix, the cervix was grasped and a acorn cannula was placed within the uterine cavity for manipulation purposes being careful not to puncture the uterus. Attention was then turned to the abdomen.    Following infiltration with Marcaine, an infraumbilical incision was made and the operative scope was placed into the peritoneal cavity with  direct visualization.  CO2 was then turned on. Visualization of the peritoneal cavity was then obtained and a brief inspection did not reveal any signs of complications from entry. Under direct observation, 5mm suprapubic port was placed taking care to respect anatomical landmarks and vessels. Once the placement of the port was complete, the actual laparoscopic procedure began.   Salpingectomy was completed with Ligasure but ligating along the mesosalpinx.  Each tube was s/p tubal ligation and were separated from the remainder of the specimen.  The round ligament was then ligated and cut. Following this, the anterior leaf of the broad ligament was then taken down on the left side, dissecting down towards the peritoneal reflection at the base of the bladder and adjacent to the cervix. Attention was paid to carefully lyse adhesions and scar with sharp and blunt dissection.  A bladder flap was created.  The same process was then repeated on the left side such that both sides met and the anterior leaflet had been appropriately skeletonized.  Attention was then turned to the vagina. The acorn cannula was removed. .  A weighted speculum was placed in the posterior vaginal vault. The cervix was grasped with a teneculum on both its anterior and posterior lips.  With downward traction, we made a circumferential incision of the vaginal mucosa. Continued dissection was met with the above-described adhesions.  Small bites with curved Heaney clamps and suture ligation allowed for continued descent of the uterus.  Eventually, a posterior colpotomy was made and long weight speculum was placed.  The uterosacral-cardinal ligament complex with #0 Vicryl suture. Additional suture ligation along the blood supply was able to reach the level of the cornua. The anterior colpotomy was the last step prior to removal of the specimen due to the adhesions.  Additional hemostasis was  achieved with ligasure of a vessel along the left side  wall.  We inspected for hemostasis, and this was secured following running locked suture of the posterior peritoneum. A modified McCall's culdoplasty was performed by placing a stitch through the uterosacral ligament on the left, purstringing the posterior peritoneum and then placing a stitch through the right uterosacral ligament and tying it all together. Hemostasis was again noted. The vaginal cuff was closed with #0 vicryl in an interrupted figure of eight fashion.   Given the difficulty of ureter visualization and adhesive disease, cystoscopy was performed and a normal bladder survey with bilateral brisk ureteral jets following IV flourescein were noted. The foley was placed to drain the bladder.   Attention then turned to the abdomen where all of the pedicles were noted to be hemostatic and cuff intact from above. Hemostasis was achieved - and confirmed on low flow.  Arista was placed as well. All gas removed from abdomen and all instruments were removed. Fascia was closed with 0' vicryl and skin closed with 4'0 vicryl. The sponge and lap counts were correct times 2 at this time.   The patient's procedure was terminated. We then awakened her. She was sent to the Recovery Room in good condition.   Nilda Simmer MD

## 2023-11-18 NOTE — Progress Notes (Signed)
[facility] Nursing Note: Alert and oriented, MAE x 4, ambulated x 2 with husband, gait very steady, pain is well controlled, currently rates post op surgical pain a 2 on 0-10 scale, area of discomfort is lower mid back, gas like pain. Also at mid lower abd at suprapubic, K pad and repositioning provides relief and able to rest. VSS, afebrile, no complaints of nausea, no vomiting post op has been noted. Abd soft, BS x 4 easily auscultated. No discomfort with abd palpation. Taking in POs (liquids and soft foods) without issues. BS clear, post op cough and deep breathing with IS instructed. Client states she has not noted any vaginal spotting or bleeding at this time. Both laparoscopic sites are WNL, edges WNL. Client was able to ambulate to restroom without assistance, voided of normal colored urine, no bleeding noted in top hat device, client is very motivated to go home and follow up with MD as per DC instructions. MD called and provided above information and supports DC to home this PM. Order to DC written per Phone Order of Dr. Lorane Gell, MD.

## 2023-11-18 NOTE — Anesthesia Procedure Notes (Signed)
Procedure Name: Intubation Date/Time: 11/18/2023 7:41 AM  Performed by: Dairl Ponder, CRNAPre-anesthesia Checklist: Patient identified, Emergency Drugs available, Suction available and Patient being monitored Patient Re-evaluated:Patient Re-evaluated prior to induction Oxygen Delivery Method: Circle System Utilized Preoxygenation: Pre-oxygenation with 100% oxygen Induction Type: IV induction Ventilation: Mask ventilation without difficulty Laryngoscope Size: Glidescope and 3 (small mouth openning with chin implant) Grade View: Grade I Tube type: Oral Tube size: 7.0 mm Number of attempts: 1 Airway Equipment and Method: Stylet and Oral airway Placement Confirmation: ETT inserted through vocal cords under direct vision, positive ETCO2 and breath sounds checked- equal and bilateral Secured at: 22 cm Tube secured with: Tape Dental Injury: Teeth and Oropharynx as per pre-operative assessment

## 2023-11-19 ENCOUNTER — Encounter (HOSPITAL_BASED_OUTPATIENT_CLINIC_OR_DEPARTMENT_OTHER): Payer: Self-pay | Admitting: Obstetrics and Gynecology

## 2023-11-20 LAB — SURGICAL PATHOLOGY

## 2023-11-20 NOTE — Discharge Summary (Signed)
Gynecology Discharge Summary  Procedure: LAVH, BS, LOA, cystoscopy  Victoria Russell is a 38 y.o. female that presented on 11/18/2023 for scheduled LAVH BS for pelvic pain, endometriosis and postablation pain.  She underwent her procedure, as well as lysis of adhesions and cystoscopy.  Her  postoperative course was uncomplicated and she desired discharge home in the evening on POD#0.  At this time, she reported well controlled pain, ambulating without difficulty, voiding spontaneously.  She was discharged home in stable condition with plans for in-office follow up.   Hemoglobin  Date Value Ref Range Status  11/13/2023 14.2 12.0 - 15.0 g/dL Final  16/07/9603 54.0 11.1 - 15.9 g/dL Final   HCT  Date Value Ref Range Status  11/13/2023 42.9 36.0 - 46.0 % Final   Hematocrit  Date Value Ref Range Status  09/12/2023 46.7 (H) 34.0 - 46.6 % Final    Physical Exam:  Gen: alert, well appearing, no distress Chest: nonlabored breathing CV: no peripheral edema Abdomen: soft, nondistended, ATTP Incisions: intact Ext: no evidence of DVT  Discharge Diagnoses: s/p LAVH BS LOA cystoscopy  Discharge Information: Date: 11/20/2023 Activity: Pelvic rest, as tolerated Diet: routine Medications: Tylenol, motrin, oxycodone Condition: stable Instructions: Discussed prior to discharge.  Discharge to: Home    Victoria Russell 11/20/2023, 9:23 PM

## 2024-02-08 ENCOUNTER — Telehealth: Admitting: Family Medicine

## 2024-02-08 DIAGNOSIS — R3989 Other symptoms and signs involving the genitourinary system: Secondary | ICD-10-CM

## 2024-02-08 MED ORDER — SULFAMETHOXAZOLE-TRIMETHOPRIM 800-160 MG PO TABS: 1.0000 | ORAL_TABLET | Freq: Two times a day (BID) | ORAL | 0 refills | Status: AC

## 2024-02-08 MED ORDER — SULFAMETHOXAZOLE-TRIMETHOPRIM 800-160 MG PO TABS: 1.0000 | ORAL_TABLET | Freq: Two times a day (BID) | ORAL | 0 refills | Status: DC

## 2024-02-08 NOTE — Addendum Note (Signed)
 Addended by: Albertha Huger on: 02/08/2024 07:11 PM   Modules accepted: Orders

## 2024-02-08 NOTE — Progress Notes (Signed)
E-Visit for Urinary Problems  We are sorry that you are not feeling well.  Here is how we plan to help!  Based on what you shared with me it looks like you most likely have a simple urinary tract infection.  A UTI (Urinary Tract Infection) is a bacterial infection of the bladder.  Most cases of urinary tract infections are simple to treat but a key part of your care is to encourage you to drink plenty of fluids and watch your symptoms carefully.  I have prescribed Bactrim DS One tablet twice a day for 5 days.  Your symptoms should gradually improve. Call us if the burning in your urine worsens, you develop worsening fever, back pain or pelvic pain or if your symptoms do not resolve after completing the antibiotic.  Urinary tract infections can be prevented by drinking plenty of water to keep your body hydrated.  Also be sure when you wipe, wipe from front to back and don't hold it in!  If possible, empty your bladder every 4 hours.  HOME CARE Drink plenty of fluids Compete the full course of the antibiotics even if the symptoms resolve Remember, when you need to go.go. Holding in your urine can increase the likelihood of getting a UTI! GET HELP RIGHT AWAY IF: You cannot urinate You get a  fever Worsening back pain occurs You see blood in your urine You feel sick to your stomach or throw up You feel like you are going to pass out  MAKE SURE YOU  Understand these instructions. Will watch your condition. Will get help right away if you are not doing well or get worse.   Thank you for choosing an e-visit.  Your e-visit answers were reviewed by a board certified advanced clinical practitioner to complete your personal care plan. Depending upon the condition, your plan could have included both over the counter or prescription medications.  Please review your pharmacy choice. Make sure the pharmacy is open so you can pick up prescription now. If there is a problem, you may contact  your provider through MyChart messaging and have the prescription routed to another pharmacy.  Your safety is important to us. If you have drug allergies check your prescription carefully.   For the next 24 hours you can use MyChart to ask questions about today's visit, request a non-urgent call back, or ask for a work or school excuse. You will get an email in the next two days asking about your experience. I hope that your e-visit has been valuable and will speed your recovery.   have provided 5 minutes of non face to face time during this encounter for chart review and documentation.    

## 2024-02-14 ENCOUNTER — Other Ambulatory Visit: Payer: Self-pay | Admitting: Nurse Practitioner

## 2024-02-14 DIAGNOSIS — F411 Generalized anxiety disorder: Secondary | ICD-10-CM

## 2024-02-16 ENCOUNTER — Other Ambulatory Visit: Payer: Self-pay | Admitting: Nurse Practitioner

## 2024-02-16 ENCOUNTER — Encounter: Payer: Self-pay | Admitting: Nurse Practitioner

## 2024-02-16 DIAGNOSIS — F411 Generalized anxiety disorder: Secondary | ICD-10-CM

## 2024-02-16 MED ORDER — ALPRAZOLAM 0.5 MG PO TABS: 0.5000 mg | ORAL_TABLET | Freq: Every evening | ORAL | 1 refills | Status: DC | PRN

## 2024-02-16 NOTE — Telephone Encounter (Signed)
 Refilled: 08/21/2023 Last OV: 10/30/2022 Next OV: 04/30/2024

## 2024-03-27 ENCOUNTER — Other Ambulatory Visit: Payer: Self-pay | Admitting: Nurse Practitioner

## 2024-03-27 DIAGNOSIS — F411 Generalized anxiety disorder: Secondary | ICD-10-CM

## 2024-04-15 ENCOUNTER — Other Ambulatory Visit: Payer: Self-pay | Admitting: Nurse Practitioner

## 2024-04-15 DIAGNOSIS — F411 Generalized anxiety disorder: Secondary | ICD-10-CM

## 2024-04-23 ENCOUNTER — Ambulatory Visit: Admitting: Nurse Practitioner

## 2024-04-23 VITALS — BP 120/82 | HR 82 | Temp 98.2°F | Ht 64.0 in | Wt 125.8 lb

## 2024-04-23 DIAGNOSIS — F411 Generalized anxiety disorder: Secondary | ICD-10-CM

## 2024-04-23 DIAGNOSIS — E039 Hypothyroidism, unspecified: Secondary | ICD-10-CM | POA: Diagnosis not present

## 2024-04-23 DIAGNOSIS — F5104 Psychophysiologic insomnia: Secondary | ICD-10-CM

## 2024-04-23 DIAGNOSIS — Z90710 Acquired absence of both cervix and uterus: Secondary | ICD-10-CM

## 2024-04-23 MED ORDER — ALPRAZOLAM 0.5 MG PO TABS: 0.5000 mg | ORAL_TABLET | Freq: Every day | ORAL | 4 refills | Status: DC

## 2024-04-23 MED ORDER — VENLAFAXINE HCL ER 150 MG PO CP24: 150.0000 mg | ORAL_CAPSULE | Freq: Every day | ORAL | 3 refills | Status: AC

## 2024-04-23 MED ORDER — TIRZEPATIDE 2.5 MG/0.5ML ~~LOC~~ SOAJ: 2.5000 mg | SUBCUTANEOUS | Status: DC

## 2024-04-23 NOTE — Progress Notes (Signed)
 Leron Glance, NP-C Phone: [phone]  Victoria Russell is a 38 y.o. female who presents today for follow up.  Discussed the use of AI scribe software for clinical note transcription with the patient, who gave verbal consent to proceed.  History of Present Illness   Victoria Russell is a 38 year old female who presents for a six-month follow-up after a hysterectomy.  In February, she underwent a hysterectomy with removal of the uterus, cervix, and fallopian tubes, while her ovaries were preserved. Post-operatively, she experienced significant pain relief and did not require any analgesics. Prior to the surgery, she had severe pelvic pain rated as seven out of ten and had been awaiting the procedure for approximately ten years. Post-surgery, she reports improved sleep quality.  She is currently taking Effexor  150 mg daily, which she increased independently prior to surgery due to exacerbated pain. 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  She continues to take Synthroid  12.5 mcg daily and reports no symptoms such as heart palpitations, temperature intolerance, or dermatological issues.  [place] and being involved in starting a private school due to concerns about local education quality. She has two children who previously attended an expensive private school, and she is now exploring more affordable educational options.      Social History   Tobacco Use  Smoking Status Never  Smokeless Tobacco Never    Current Outpatient Medications on File Prior to Visit  Medication Sig Dispense Refill   SYNTHROID  25 MCG tablet TAKE 0.5 TABLETS BY MOUTH DAILY. 45 tablet 3   No current facility-administered medications on file prior to visit.     ROS see history of present illness  Objective  Physical Exam Vitals:   04/23/24 1334   BP: 120/82  Pulse: 82  Temp: 98.2 F (36.8 C)  SpO2: 99%    BP Readings from Last 3 Encounters:  04/23/24 120/82  11/18/23 118/78  10/31/23 110/80   Wt Readings from Last 3 Encounters:  04/23/24 125 lb 12.8 oz (57.1 kg)  11/18/23 149 lb (67.6 kg)  10/31/23 142 lb (64.4 kg)    Physical Exam Constitutional:      General: She is not in acute distress.    Appearance: Normal appearance.  HENT:     Head: Normocephalic.  Cardiovascular:     Rate and Rhythm: Normal rate and regular rhythm.     Heart sounds: Normal heart sounds.  Pulmonary:     Effort: Pulmonary effort is normal.     Breath sounds: Normal breath sounds.  Skin:    General: Skin is warm and dry.  Neurological:     General: No focal deficit present.     Mental Status: She is alert.  Psychiatric:        Mood and Affect: Mood normal.        Behavior: Behavior normal.      Assessment/Plan: Please see individual problem list.  Hypothyroidism, unspecified type Assessment & Plan: She is on levothyroxine  12.5 mcg and reports no symptoms of thyroid  dysfunction. Continue levothyroxine  12.5 mcg daily and check TSH.   Orders: -     Comprehensive metabolic panel with GFR -     TSH  Generalized anxiety disorder Assessment & Plan: She is on Effexor  XR 150 mg, which was increased pre-surgery. The current dose effectively manages stress.  She is advised against self-adjusting the dosage. Continue Effexor  XR 150 mg daily and Xanax  at bedtime. Encouraged to contact if worsening symptoms, unusual behavior changes or suicidal thoughts occur.   Orders: -     ALPRAZolam ; Take 1 tablet (0.5 mg total) by mouth at bedtime.  Dispense: 30 tablet; Refill: 4 -     Venlafaxine  HCl ER; Take 1 capsule (150 mg total) by mouth daily with breakfast.  Dispense: 90 capsule; Refill: 3  Psychophysiological insomnia Assessment & Plan: Intermittent sleep disturbances have improved post-surgery. She takes alprazolam  at bedtime. Continue  alprazolam  at bedtime for sleep. Refills sent. PDMP reviewed.    S/P hysterectomy Assessment & Plan: She underwent a hysterectomy in February with removal of the uterus, cervix, and fallopian tubes, while the ovaries remain intact. The procedure was complicated by scar tissue, but recovery has been positive with significant pain relief and no current pain. No Pap smears are required.      Return in about 6 months (around 10/24/2024) for Follow up.   Leron Glance, NP-C [facility]

## 2024-04-24 LAB — COMPREHENSIVE METABOLIC PANEL WITH GFR
ALT: 17 IU/L (ref 0–32)
AST: 19 IU/L (ref 0–40)
Albumin: 4.5 g/dL (ref 3.9–4.9)
Alkaline Phosphatase: 77 IU/L (ref 44–121)
BUN/Creatinine Ratio: 14 (ref 9–23)
BUN: 12 mg/dL (ref 6–20)
Bilirubin Total: 0.3 mg/dL (ref 0.0–1.2)
CO2: 18 mmol/L — ABNORMAL LOW (ref 20–29)
Calcium: 9.3 mg/dL (ref 8.7–10.2)
Chloride: 98 mmol/L (ref 96–106)
Creatinine, Ser: 0.85 mg/dL (ref 0.57–1.00)
Globulin, Total: 2.8 g/dL (ref 1.5–4.5)
Glucose: 80 mg/dL (ref 70–99)
Potassium: 3.7 mmol/L (ref 3.5–5.2)
Sodium: 134 mmol/L (ref 134–144)
Total Protein: 7.3 g/dL (ref 6.0–8.5)
eGFR: 90 mL/min/1.73 (ref >59)

## 2024-04-24 LAB — TSH: TSH: 1.58 u[IU]/mL (ref 0.450–4.500)

## 2024-04-26 ENCOUNTER — Ambulatory Visit: Payer: Self-pay | Admitting: Nurse Practitioner

## 2024-04-30 ENCOUNTER — Ambulatory Visit: Payer: 59 | Admitting: Nurse Practitioner

## 2024-05-05 ENCOUNTER — Encounter: Payer: Self-pay | Admitting: Nurse Practitioner

## 2024-05-05 DIAGNOSIS — Z90710 Acquired absence of both cervix and uterus: Secondary | ICD-10-CM | POA: Insufficient documentation

## 2024-05-05 NOTE — Assessment & Plan Note (Signed)
 She is on Effexor  XR 150 mg, which was increased pre-surgery. The current dose effectively manages stress. She is advised against self-adjusting the dosage. Continue Effexor  XR 150 mg daily and Xanax  at bedtime. Encouraged to contact if worsening symptoms, unusual behavior changes or suicidal thoughts occur.

## 2024-05-05 NOTE — Assessment & Plan Note (Signed)
 Intermittent sleep disturbances have improved post-surgery. She takes alprazolam  at bedtime. Continue alprazolam  at bedtime for sleep. Refills sent. PDMP reviewed.

## 2024-05-05 NOTE — Assessment & Plan Note (Signed)
 She underwent a hysterectomy in February with removal of the uterus, cervix, and fallopian tubes, while the ovaries remain intact. The procedure was complicated by scar tissue, but recovery has been positive with significant pain relief and no current pain. No Pap smears are required.

## 2024-05-05 NOTE — Assessment & Plan Note (Signed)
 She is on levothyroxine  12.5 mcg and reports no symptoms of thyroid  dysfunction. Continue levothyroxine  12.5 mcg daily and check TSH.

## 2024-06-23 ENCOUNTER — Other Ambulatory Visit: Payer: Self-pay | Admitting: Nurse Practitioner

## 2024-06-23 DIAGNOSIS — E039 Hypothyroidism, unspecified: Secondary | ICD-10-CM

## 2024-06-24 ENCOUNTER — Telehealth: Payer: Self-pay

## 2024-06-24 ENCOUNTER — Other Ambulatory Visit (HOSPITAL_COMMUNITY): Payer: Self-pay

## 2024-06-24 NOTE — Telephone Encounter (Signed)
 Pharmacy Patient Advocate Encounter   Received notification from Physician's Office that prior authorization for Synthroid  tablets is required/requested.   Insurance verification completed.   The patient is insured through [employer] .   Per test claim: PA required; PA submitted to above mentioned insurance via Latent Key/confirmation #/EOC [id number] Status is pending

## 2024-06-25 NOTE — Telephone Encounter (Signed)
 Noted

## 2024-06-28 ENCOUNTER — Other Ambulatory Visit (HOSPITAL_COMMUNITY): Payer: Self-pay

## 2024-06-28 NOTE — Telephone Encounter (Signed)
 Pharmacy Patient Advocate Encounter  Received notification from [employer] that Prior Authorization for Synthroid  tablets  has been DENIED.  Full denial letter will be uploaded to the media tab. See denial reason below.  This medicine is covered only if:  All of the following:  (1) Your doctor submits medical records documenting that you have had an inadequate response, failed, or cannot use levothyroxine  (generic Synthroid ) (date and duration of trial must be provided). (2) Your doctor provides an explanation for how the requested medication will work for you if you have failed on the covered therapeutically equivalent product which has the same active  ingredient (for example, you had an adverse reaction to an inactive ingredient in the alternative product).  PA #/Case ID/Reference #: [id number]

## 2024-07-01 ENCOUNTER — Telehealth: Payer: Self-pay

## 2024-07-01 ENCOUNTER — Other Ambulatory Visit: Payer: Self-pay | Admitting: Nurse Practitioner

## 2024-07-01 ENCOUNTER — Other Ambulatory Visit (HOSPITAL_COMMUNITY): Payer: Self-pay

## 2024-07-01 DIAGNOSIS — E039 Hypothyroidism, unspecified: Secondary | ICD-10-CM

## 2024-07-01 MED ORDER — SYNTHROID 25 MCG PO TABS: 12.5000 ug | ORAL_TABLET | Freq: Every day | ORAL | 3 refills | Status: AC

## 2024-07-01 NOTE — Telephone Encounter (Signed)
 We typically do an eligibility check before we submit prior auth. Please see denial on media tab. We received denial for updated insurance.

## 2024-07-01 NOTE — Telephone Encounter (Signed)
Noted message sent to pt.

## 2024-07-01 NOTE — Telephone Encounter (Signed)
 Prior Authorization for Synthroid  tablets  pt has updated insurance

## 2024-10-11 ENCOUNTER — Encounter: Payer: Self-pay | Admitting: Nurse Practitioner

## 2024-10-11 DIAGNOSIS — F411 Generalized anxiety disorder: Secondary | ICD-10-CM

## 2024-10-11 MED ORDER — ALPRAZOLAM 0.5 MG PO TABS: 0.5000 mg | ORAL_TABLET | Freq: Every day | ORAL | 4 refills | Status: AC

## 2024-12-09 ENCOUNTER — Ambulatory Visit: Admitting: Nurse Practitioner
